# Patient Record
Sex: Male | Born: 1990 | Race: Black or African American
Health system: Southern US, Community
[De-identification: ages and names within clinical notes are randomized; demographics above are authoritative.]

## PROBLEM LIST (undated history)

## (undated) DIAGNOSIS — K589 Irritable bowel syndrome without diarrhea: Secondary | ICD-10-CM

## (undated) DIAGNOSIS — K639 Disease of intestine, unspecified: Secondary | ICD-10-CM

## (undated) NOTE — ED Triage Notes (Signed)
Formatting of this note might be different from the original.  Presents with abdominal pain states " I believe its from the alcohol I drank last night.  Denies n/v   Electronically signed by Elaina Hoops, RN at 09/28/2021 11:26 AM EST

## (undated) NOTE — ED Provider Notes (Signed)
Formatting of this note is different from the original.  Images from the original note were not included.  EMERGENCY DEPARTMENT HISTORY AND PHYSICAL EXAM    Date: 09/28/2021  Patient Name: Eric [name]    History of Presenting Illness     Chief Complaint   Patient presents with    Abdominal Pain     History Provided By: Patient    HPI: Eric [name], 6 y.o. male presents to the emergency department complaint of epigastric abdominal pain x1 day.  Patient reports the pain is sharp and burning in character, moderate in severity, intermittent without noted aggravating relieving factors.  Patient reports associated nausea without vomiting.  Patient reports history of the same.  Patient denies fevers or chills.  Patient reports he drank alcohol last night and believes that is what is causing his pain, reports history of reflux and gastritis.  Patient denies radiation of the pain.    There are no other complaints, changes, or physical findings at this time.    PCP: No primary care provider on file.    No current facility-administered medications on file prior to encounter.     No current outpatient medications on file prior to encounter.     Past History     Past Medical History:  No past medical history on file.    Past Surgical History:  No past surgical history on file.    Family History:  No family history on file.    Social History:      Allergies:  No Known Allergies    Review of Systems   Review of Systems  Review of Systems   Constitutional: Negative for chills and fever.   HENT: Negative for sinus pressure and sinus pain.    Eyes: Negative for photophobia and redness.   Respiratory: Negative for shortness of breath and wheezing.    Cardiovascular: Negative for chest pain and palpitations.   Gastrointestinal: Positive for abdominal pain and nausea.   Genitourinary: Negative for flank pain and hematuria.   Musculoskeletal: Negative for arthralgias and gait problem.   Skin: Negative for color change and pallor.    Neurological: Negative for dizziness and weakness.     Physical Exam   Physical Exam  Physical Exam  Constitutional:       General: No acute distress.     Appearance: Normal appearance.  Not toxic-appearing.   HENT:      Head: Normocephalic and atraumatic.      Nose: Nose normal.      Mouth/Throat:      Mouth: Mucous membranes are moist.   Eyes:      Extraocular Movements: Extraocular movements intact.      Pupils: Pupils are equal, round, and reactive to light.   Cardiovascular:      Rate and Rhythm: Normal rate.      Pulses: Normal pulses.   Pulmonary:      Effort: Pulmonary effort is normal.      Breath sounds: No stridor.   Abdominal:      General: Abdomen is flat. There is no distension.  Mild epigastric tenderness to palpation without guarding or rebound.  Musculoskeletal:         General: Normal range of motion.      Cervical back: Normal range of motion and neck supple.   Skin:     General: Skin is warm and dry.      Capillary Refill: Capillary refill takes less than 2 seconds.   Neurological:  General: No focal deficit present.      Mental Status: Aert and oriented to person, place, and time.   Psychiatric:         Mood and Affect: Mood normal.         Behavior: Behavior normal.     Lab and Diagnostic Study Results   Labs -   No results found for this or any previous visit (from the past 12 hour(s)).    Radiologic Studies -   @lastxrresult @  CT Results  (Last 48 hours)      None         CXR Results  (Last 48 hours)      None         Medical Decision Making and ED Course   Differential Diagnosis & Medical Decision Making Provider Note:     - I am the first provider for this patient.  I reviewed the vital signs, available nursing notes, past medical history, past surgical history, family history and social history. The patients presenting problems have been discussed, and they are in agreement with the care plan formulated and outlined with them.  I have encouraged them to ask questions as they arise  throughout their visit.    Vital Signs-Reviewed the patient's vital signs.  Patient Vitals for the past 12 hrs:   Temp Pulse Resp BP SpO2   09/28/21 1125 97.8 F (36.6 C) 89 16 130/87 100 %     E    Disposition   Disposition: [facility]- Adult Discharges: All of the diagnostic tests were reviewed and questions answered. Diagnosis, care plan and treatment options were discussed.  The patient understands the instructions and will follow up as directed. The patients results have been reviewed with them.  They have been counseled regarding their diagnosis.  The patient verbally convey understanding and agreement of the signs, symptoms, diagnosis, treatment and prognosis and additionally agrees to follow up as recommended with their PCP in 24 - 48 hours.  They also agree with the care-plan and convey that all of their questions have been answered.  I have also put together some discharge instructions for them that include: 1) educational information regarding their diagnosis, 2) how to care for their diagnosis at home, as well a 3) list of reasons why they would want to return to the ED prior to their follow-up appointment, should their condition change.    DISCHARGE PLAN:  1. There are no discharge medications for this patient.    2.   Follow-up Information    None      3.  Return to ED if worse   4. There are no discharge medications for this patient.    Diagnosis/Clinical Impression     Clinical Impression:   1. Abdominal pain, epigastric      Attestations: ICecil Cranker, MD, am the primary clinician of record.    Please note that this dictation was completed with Dragon, the computer voice recognition software.  Quite often unanticipated grammatical, syntax, homophones, and other interpretive errors are inadvertently transcribed by the computer software.  Please disregard these errors.  Please excuse any errors that have escaped final proofreading.  Thank you.      Electronically signed by Cecil Cranker, MD at 09/28/2021 12:59 PM EST

---

## 2015-12-09 ENCOUNTER — Encounter (HOSPITAL_COMMUNITY): Payer: Self-pay | Admitting: Emergency Medicine

## 2015-12-09 ENCOUNTER — Emergency Department (HOSPITAL_COMMUNITY)
Admission: EM | Admit: 2015-12-09 | Discharge: 2015-12-09 | Disposition: A | Discharge status: Home / Self Care | Payer: Self-pay | Attending: Emergency Medicine | Admitting: Emergency Medicine

## 2015-12-09 DIAGNOSIS — K5900 Constipation, unspecified: Secondary | ICD-10-CM

## 2015-12-09 DIAGNOSIS — R509 Fever, unspecified: Secondary | ICD-10-CM | POA: Insufficient documentation

## 2015-12-09 DIAGNOSIS — M545 Low back pain, unspecified: Secondary | ICD-10-CM

## 2015-12-09 LAB — URINALYSIS, ROUTINE W REFLEX MICROSCOPIC
Bilirubin Urine: NEGATIVE
Glucose, UA: NEGATIVE mg/dL
HGB URINE DIPSTICK: NEGATIVE
Ketones, ur: NEGATIVE mg/dL
LEUKOCYTES UA: NEGATIVE
NITRITE: NEGATIVE
PH: 7 (ref 5.0–8.0)
PROTEIN: NEGATIVE mg/dL
Specific Gravity, Urine: 1.005 (ref 1.005–1.030)

## 2015-12-09 MED ORDER — METHOCARBAMOL 500 MG PO TABS: 500.0000 mg | ORAL_TABLET | Freq: Two times a day (BID) | ORAL | Status: DC

## 2015-12-09 MED ORDER — KETOROLAC TROMETHAMINE 60 MG/2ML IM SOLN
60.0000 mg | Freq: Once | INTRAMUSCULAR | Status: AC
  Administered 2015-12-09: 60 mg via INTRAMUSCULAR
  Filled 2015-12-09: qty 2

## 2015-12-09 MED ORDER — IBUPROFEN 800 MG PO TABS: 800.0000 mg | ORAL_TABLET | Freq: Three times a day (TID) | ORAL | Status: DC

## 2015-12-09 MED ORDER — CALCIUM POLYCARBOPHIL 625 MG PO TABS: 625.0000 mg | ORAL_TABLET | Freq: Every day | ORAL | Status: DC

## 2015-12-09 NOTE — Discharge Instructions (Signed)
You have been seen today for back pain. Your lab tests showed no abnormalities. Follow up with PCP as needed. Return to ED should symptoms worsen. Use the ibuprofen for pain and inflammation. laqad kan yanzur alyawm lialam alzzuhir. wa'azharat alfuhusat almukhbariat alkhassat bik 'ay shudhudhin. mutabaeat mae hizb almutamar alshshaebi hsb alhajati. aleawdat 'iilaa aldduef aljinsi yjb 'an tasu' al'aeradu. aistikhdam al'iibubrufin litakhfif al'alm walailtihabat.  Emergency Department Resource Guide 1) Find a Doctor and Pay Out of Pocket Although you won't have to find out who is covered by your insurance plan, it is a good idea to ask around and get recommendations. You will then need to call the office and see if the doctor you have chosen will accept you as a new patient and what types of options they offer for patients who are self-pay. Some doctors offer discounts or will set up payment plans for their patients who do not have insurance, but you will need to ask so you aren't surprised when you get to your appointment.  2) Contact Your Local Health Department Not all health departments have doctors that can see patients for sick visits, but many do, so it is worth a call to see if yours does. If you don't know where your local health department is, you can check in your phone book. The CDC also has a tool to help you locate your state's health department, and many state websites also have listings of all of their local health departments.  3) Find a Walk-in Clinic If your illness is not likely to be very severe or complicated, you may want to try a walk in clinic. These are popping up all over the country in pharmacies, drugstores, and shopping centers. They're usually staffed by nurse practitioners or physician assistants that have been trained to treat common illnesses and complaints. They're usually fairly quick and inexpensive. However, if you have serious medical issues or chronic medical problems,  these are probably not your best option.  No Primary Care Doctor: - Call Health Connect at  [phone] - they can help you locate a primary care doctor that  accepts your insurance, provides certain services, etc. - Physician Referral Service- [phone]  Chronic Pain Problems: Organization         Address  Phone   Notes  [facility]  [phone] Patients need to be referred by their primary care doctor.   Medication Assistance: Organization         Address  Phone   Notes  [facility] Medication [facility] [address] [place] [place], [place] [address] [phone] --Must be a resident of [facility] -- Must have NO insurance coverage whatsoever (no Medicaid/ Medicare, etc.) -- The pt. MUST have a primary care doctor that directs their care regularly and follows them in the community   [facility]  [phone]   [employer]  [phone]    Agencies that provide inexpensive medical care: Organization         Address  Phone   Notes  [facility]  [phone]   [facility]    [phone]   [facility] [address] [place], [place] [address] [phone]   [facility] [address] [place] [address], [place] [phone]   [facility]anned Parenthood    [phone]   [facility]    [phone]   [facility]  [address]  Lynne [place] Phone:  [phone], Fax:  [phone] Hours of Operation:  9 am - 6 pm, M-F.  Also accepts Medicaid/Medicare and self-pay.  [facility]  [address], [place] Phone: [phone], Fax: [phone]. Hours of Operation:  8:30 am - 5:30 pm, M-F.  Also accepts Medicaid and self-pay.  [facility] [place] [address], [place] Phone: [phone]   [facility] [address] Natasha Bence [place], [place] [phone], Ext. [phone] Mondays &  Thursdays: 7-9 AM.  First 15 patients are seen on a first come, first serve basis.    Medicaid-accepting [facility] Providers:  Organization         Address  Phone   Notes  [facility] [address], [place] [phone] Also accepts self-pay patients.  [facility] [address] Laurell Josephs [place], [place]  [phone]   [facility] [address], [place] [phone]   [facility] [address], [place] [phone]   Renaye Rakers [address], [place]   [phone] Only accepts [place] Access [place] patients after they have their name applied to their card.   Self-Pay (no insurance) in [facility]:  Organization         Address  Phone   Notes  Sickle Cell Patients, [facility] [address] [place], [place] [phone]   [facility] [address] [place], [place] [phone]   [facility] [place]  [address], [place] [phone]   [facility]/Dr. Osei-Bonsu  [address] [place] or [address], [place] [phone] Phone number for both [place] and [place] locations is the same.  [facility] and [facility] [address], [place] [phone]   [facility] [address] [place] or [address] [phone]   [facility] [address] [place] [phone], phone; [phone], fax Sees patients 1st and 3rd Saturday of every month.  Must not qualify for public or private insurance (i.e. Medicaid, Medicare, [place] Health Choice, Veterans' Benefits)  Household income should be no more than 200% of the poverty level The clinic cannot treat you if you are pregnant or think you are pregnant  Sexually transmitted diseases are not treated at the  clinic.    Dental Care: Organization         Address  Phone  Notes  [facility] Department of [facility] [address] [place], [place] [phone] Accepts children up to age 13 who are enrolled in [place] or [place] Health Choice; pregnant women with a Medicaid card; and children who have applied for Medicaid or [place] Health Choice, but were declined, whose parents can pay a reduced fee at time of service.  [facility] Department of [facility]  [address], [place] [phone] Accepts children up to age 85 who are enrolled in [place] or [place] Health Choice; pregnant women with a Medicaid card; and children who have applied for Medicaid or [place] Health Choice, but were declined, whose parents can pay a reduced fee at time of service.  [facility]  [address] [place], [place] [phone] Patients are seen  by appointment only. Walk-ins are not accepted. [facility] will see patients 69 years of age and older. Monday - Tuesday (8am-5pm) Most Wednesdays (8:30-5pm) $30 per visit, cash only  [facility]  [address], [facility] [phone] Patients are seen by appointment only. Walk-ins are not accepted. [facility] will see patients 43 years of age and older. One Wednesday Evening (Monthly: Volunteer Based).  $30 per visit, cash only  [employer]  [phone] for adults; Children under age 26, call Graduate Pediatric Dentistry at [phone]. Children aged 69-14, please call [phone] to request a pediatric application.  Dental services are provided in all areas of dental care including fillings, crowns and bridges, complete and partial dentures, implants, gum treatment, root canals, and extractions. Preventive care is also provided. Treatment is provided to both adults and children. Patients are selected via a lottery and there is often a  waiting list.   [facility] [address] [place]  [phone] [email or web address]   [facility] [address] [place], [place] [phone], Ext. [phone] Second and Fourth Thursday of each month, opens at 6:30 AM; Clinic ends at 9 AM.  Patients are seen on a first-come first-served basis, and a limited number are seen during each clinic.   [facility]  [address] Ether Griffins [place], [place] [phone]   Eligibility Requirements You must have lived in [place], [place], or [place] counties for at least the last three months.   You cannot be eligible for state or federal sponsored [place], including [employer], [place], or [employer].   You generally cannot be eligible for healthcare insurance through your employer.    How to apply: Eligibility screenings are held every Tuesday and Wednesday afternoon from 1:00 pm until 4:00 pm. You do not need an appointment for the interview!  [facility] [address], [place], [place] [phone]   [facility]  [phone]   [facility] Health Department  [phone]   [facility] Health Department  [phone]    Behavioral Health Resources in the Community: Intensive Outpatient Programs Organization         Address  Phone  Notes  [facility] [address], [place], [place] [phone]   [facility] [address], [place], [place] [phone]   [facility] [address] [place], [place]  [phone]   [facility] [address],  [place], [place] [phone] or [phone]   Substance Abuse Resources Organization         Address  Phone  Notes  [facility]  5140652066   [facility]  8310292611   [facility]  [phone]   Floydene Flock  [phone]   Residential & Outpatient Substance Abuse  Program  [phone]   Psychological Services Organization         Address  Phone  Notes  [facility] Behavior[facility] Health  [phone]   [facility] Services  [phone]   [facility] Mental Health [address] [place] [phone] or [phone]    Mobile Crisis Teams Organization         Address  Phone  Notes  [facility], Mobile Crisis Care Unit  [phone]   [facility]  [address] [place], [place] [phone]   [facility] [address] [place]  [phone]    Self-Help/Support Groups Organization         Address  Phone             Notes  [facility] [place] - variety of support groups  336- I7437963 Call for more information  Narcotics Anonymous (NA), [facility] [address], [employer] [place]  2 meetings at this location   Statistician         Address  Phone  Notes  [facility] [address],    [place]  [phone]   [facility]  [address], [place] [address], [place], [place] [phone]   [facility] [address] [place], [place] [phone] Admissions: 8am-3pm M-F  [facility] [address]    [place], [place] [phone]   [facility] [address] [place], [place], [place] [phone]   [facility] [address]  [place], [place] [phone]   [facility] - Intensive Outpatient [address] Laurell Josephs [address], [place], [place] [phone]   [facility] ([facility] [address] [place]  [place], [place] [phone] or [phone]   [facility] ([facility]) [address] [place], [place] [phone] Accepts Medicaid  [facility] [address],  [place] [phone] Substance Abuse/Addiction Treatment   [facility] Organization          Address  Phone  Notes  [facility]  [phone]   Angie Fava, PhD [address] Ervin Knack [place], [place]   [phone] or [phone]   [facility]al   [address] [place], [place] [phone]   [facility] [address], [place], [place] [phone] Insurance/Medicaid/sponsorship through [facility] and Families [address]                                    [place], [place] [phone] Therapy/tele-psych/case  [facility] [address] [place], [place] [phone]    Dr. Lolly Mustache  [phone]   [facility]  [employer] [facility] Dept. 1) [address] [place] 2) [address] [place] 3)  [address], [place] [phone]  [phone]   [facility] Child Abuse Hotline 403-106-7838 or [phone] (After Hours)

## 2015-12-09 NOTE — ED Notes (Signed)
C/o L lower back pain.  Pt states he has been lifting heavy items at work.  Denies urinary complaints.

## 2015-12-09 NOTE — ED Provider Notes (Signed)
CSN: [id number]     Arrival date & time 12/09/15  2223 History  By signing my name below, I, Aaron Ray, attest that this documentation has been prepared under the direction and in the presence of non-physician practitioner, Aaron Rutherford, PA-C. Electronically Signed: Freida Ray, Scribe. 12/09/2015. 11:02 PM.      Chief Complaint  Patient presents with  . Back Pain     The history is provided by the patient. A language interpreter was used (Arabic).     HPI Comments:  Aaron Ray is a 25 y.o. male who presents to the Emergency Department complaining of 8/10 sharp left lower back pain x 2 days. He notes he has been doing heavy lifting at home. Pt's pain is exacerbated when he bends over.  He also reports associated subjective fever and consitpation. His last BM was today ~ 1 hour ago. Pt notes the stool was hard. He denies urinary symptoms, nausea, vomiting, neurologic deficits, or any other complaints. No alleviating factors noted. Pt is not a native english speaker and speaks only Arabic, language line was used to obtain history and to perform physical exam.      History reviewed. No pertinent past medical history. History reviewed. No pertinent past surgical history. No family history on file. Social History  Substance Use Topics  . Smoking status: Never Smoker   . Smokeless tobacco: None  . Alcohol Use: No    Review of Systems  Constitutional: Positive for fever (subjective).  Gastrointestinal: Positive for constipation.  Musculoskeletal: Positive for back pain.    Allergies  Review of patient's allergies indicates not on file.  Home Medications   Prior to Admission medications   Medication Sig Start Date End Date Taking? Authorizing Provider  ibuprofen (ADVIL,MOTRIN) 800 MG tablet Take 1 tablet (800 mg total) by mouth 3 (three) times daily. 12/09/15   Aaron Ray C Aaron Haefner, PA-C  methocarbamol (ROBAXIN) 500 MG tablet Take 1 tablet (500 mg total) by mouth 2 (two) times daily.  12/09/15   Aaron Ray C Shaynah Hund, PA-C  polycarbophil (FIBERCON) 625 MG tablet Take 1 tablet (625 mg total) by mouth daily. Aaron Ray 1 qars (625 malagh alkla) ean tariq alfamm yawmiaa. 12/09/15   Aaron Ray C Undrea Archbold, PA-C   BP 140/94 mmHg  Pulse 86  Temp(Src) 98.4 F (36.9 C) (Oral)  Resp 16  Ht  (1.854 m)  Wt 57.72 kg  BMI 16.79 kg/m2  SpO2 100% Physical Exam  Constitutional: He is oriented to person, place, and time. He appears well-developed and well-nourished. No distress.  HENT:  Head: Normocephalic and atraumatic.  Eyes: Conjunctivae are normal.  Neck: Normal range of motion. Neck supple.  Cardiovascular: Normal rate and intact distal pulses.   Pulmonary/Chest: Effort normal.  Abdominal: Soft. He exhibits no distension. There is no tenderness. There is no CVA tenderness.  Musculoskeletal:  FROM to all extremities and spine. No paraspinal tenderness Tenderness lower left lumbar musculature   Lymphadenopathy:    He has no cervical adenopathy.  Neurological: He is alert and oriented to person, place, and time. He has normal strength and normal reflexes. No cranial nerve deficit or sensory deficit. He exhibits normal muscle tone. Coordination and gait normal.  No sensory deficits. Strength 5/5 in all extremities. No gait disturbance. Coordination intact.   Skin: Skin is warm and dry.  Psychiatric: He has a normal mood and affect.  Nursing note and vitals reviewed.   ED Course  Procedures  DIAGNOSTIC STUDIES:  Oxygen Saturation is 100% on RA,  normal- by my interpretation.    COORDINATION OF CARE:  10:58 PM Will order pain meds in ED and discharge with back exercises. Discussed treatment plan with pt at bedside with the aid of the language line and pt agreed to plan.    MDM   Final diagnoses:  Left-sided low back pain without sciatica  Constipation, unspecified constipation type    Aaron Ray presents with back pain for the last 2 days after an incident of heavy  lifting.  Patient with back pain.  No neurological deficits and normal neuro exam.  Patient is afebrile, not tachycardic, is normotensive, nontoxic appearing, and is in no apparent distress. Patient is ambulatory.  No loss of bowel or bladder control.  No concern for cauda equina.  No night sweats, weight loss, h/o cancer, IVDA, no recent procedure to back. No urinary symptoms suggestive of UTI.  Suspect lumbar muscle strain. Supportive care and return precaution discussed. Appears safe for discharge at this time. Follow up as indicated in discharge paperwork.    I personally performed the services described in this documentation, which was scribed in my presence. The recorded information has been reviewed and is accurate.    Aaron Pancoast, PA-C 12/09/15 2341  Aaron Crease, MD 12/10/15 [phone]

## 2015-12-11 LAB — URINE CULTURE: Culture: NO GROWTH

## 2016-01-07 ENCOUNTER — Emergency Department (HOSPITAL_COMMUNITY)
Admission: EM | Admit: 2016-01-07 | Discharge: 2016-01-07 | Disposition: A | Discharge status: Home / Self Care | Payer: Self-pay | Attending: Emergency Medicine | Admitting: Emergency Medicine

## 2016-01-07 ENCOUNTER — Encounter (HOSPITAL_COMMUNITY): Payer: Self-pay | Admitting: Emergency Medicine

## 2016-01-07 DIAGNOSIS — R42 Dizziness and giddiness: Secondary | ICD-10-CM | POA: Insufficient documentation

## 2016-01-07 DIAGNOSIS — Z791 Long term (current) use of non-steroidal anti-inflammatories (NSAID): Secondary | ICD-10-CM | POA: Insufficient documentation

## 2016-01-07 DIAGNOSIS — Z8719 Personal history of other diseases of the digestive system: Secondary | ICD-10-CM | POA: Insufficient documentation

## 2016-01-07 DIAGNOSIS — R112 Nausea with vomiting, unspecified: Secondary | ICD-10-CM | POA: Insufficient documentation

## 2016-01-07 DIAGNOSIS — Z79899 Other long term (current) drug therapy: Secondary | ICD-10-CM | POA: Insufficient documentation

## 2016-01-07 LAB — COMPREHENSIVE METABOLIC PANEL
ALBUMIN: 3.9 g/dL (ref 3.5–5.0)
ALK PHOS: 61 U/L (ref 38–126)
ALT: 10 U/L — AB (ref 17–63)
ANION GAP: 7 (ref 5–15)
AST: 16 U/L (ref 15–41)
BILIRUBIN TOTAL: 1.4 mg/dL — AB (ref 0.3–1.2)
BUN: 11 mg/dL (ref 6–20)
CALCIUM: 9.2 mg/dL (ref 8.9–10.3)
CO2: 25 mmol/L (ref 22–32)
CREATININE: 1.05 mg/dL (ref 0.61–1.24)
Chloride: 106 mmol/L (ref 101–111)
Glucose, Bld: 105 mg/dL — ABNORMAL HIGH (ref 65–99)
Potassium: 3.7 mmol/L (ref 3.5–5.1)
Sodium: 138 mmol/L (ref 135–145)
TOTAL PROTEIN: 6.9 g/dL (ref 6.5–8.1)

## 2016-01-07 LAB — CBC
HCT: 42.8 % (ref 39.0–52.0)
HEMOGLOBIN: 14.4 g/dL (ref 13.0–17.0)
MCH: 24.1 pg — AB (ref 26.0–34.0)
MCHC: 33.6 g/dL (ref 30.0–36.0)
MCV: 71.6 fL — ABNORMAL LOW (ref 78.0–100.0)
PLATELETS: 189 10*3/uL (ref 150–400)
RBC: 5.98 MIL/uL — AB (ref 4.22–5.81)
RDW: 14.3 % (ref 11.5–15.5)
WBC: 9.3 10*3/uL (ref 4.0–10.5)

## 2016-01-07 LAB — LIPASE, BLOOD: LIPASE: 26 U/L (ref 11–51)

## 2016-01-07 MED ORDER — MECLIZINE HCL 25 MG PO TABS
25.0000 mg | ORAL_TABLET | Freq: Once | ORAL | Status: AC
  Administered 2016-01-07: 25 mg via ORAL
  Filled 2016-01-07: qty 1

## 2016-01-07 MED ORDER — ONDANSETRON 4 MG PO TBDP
ORAL_TABLET | ORAL | Status: AC
  Filled 2016-01-07: qty 1

## 2016-01-07 MED ORDER — PANTOPRAZOLE SODIUM 40 MG IV SOLR
40.0000 mg | Freq: Once | INTRAVENOUS | Status: AC
  Administered 2016-01-07: 40 mg via INTRAVENOUS
  Filled 2016-01-07: qty 40

## 2016-01-07 MED ORDER — ONDANSETRON 4 MG PO TBDP
4.0000 mg | ORAL_TABLET | Freq: Once | ORAL | Status: AC
Start: 2016-01-07 — End: 2016-01-07
  Administered 2016-01-07: 4 mg via ORAL

## 2016-01-07 MED ORDER — SODIUM CHLORIDE 0.9 % IV BOLUS (SEPSIS)
1000.0000 mL | Freq: Once | INTRAVENOUS | Status: AC
  Administered 2016-01-07: 1000 mL via INTRAVENOUS

## 2016-01-07 NOTE — ED Notes (Signed)
Abigail, PA at bedside at this time.  

## 2016-01-07 NOTE — Discharge Instructions (Signed)
Nausea and Vomiting °Nausea is a sick feeling that often comes before throwing up (vomiting). Vomiting is a reflex where stomach contents come out of your mouth. Vomiting can cause severe loss of body fluids (dehydration). Children and elderly adults can become dehydrated quickly, especially if they also have diarrhea. Nausea and vomiting are symptoms of a condition or disease. It is important to find the cause of your symptoms. °CAUSES  °· Direct irritation of the stomach lining. This irritation can result from increased acid production (gastroesophageal reflux disease), infection, food poisoning, taking certain medicines (such as nonsteroidal anti-inflammatory drugs), alcohol use, or tobacco use. °· Signals from the brain. These signals could be caused by a headache, heat exposure, an inner ear disturbance, increased pressure in the brain from injury, infection, a tumor, or a concussion, pain, emotional stimulus, or metabolic problems. °· An obstruction in the gastrointestinal tract (bowel obstruction). °· Illnesses such as diabetes, hepatitis, gallbladder problems, appendicitis, kidney problems, cancer, sepsis, atypical symptoms of a heart attack, or eating disorders. °· Medical treatments such as chemotherapy and radiation. °· Receiving medicine that makes you sleep (general anesthetic) during surgery. °DIAGNOSIS °Your caregiver may ask for tests to be done if the problems do not improve after a few days. Tests may also be done if symptoms are severe or if the reason for the nausea and vomiting is not clear. Tests may include: °· Urine tests. °· Blood tests. °· Stool tests. °· Cultures (to look for evidence of infection). °· X-rays or other imaging studies. °Test results can help your caregiver make decisions about treatment or the need for additional tests. °TREATMENT °You need to stay well hydrated. Drink frequently but in small amounts. You may wish to drink water, sports drinks, clear broth, or eat frozen  ice pops or gelatin dessert to help stay hydrated. When you eat, eating slowly may help prevent nausea. There are also some antinausea medicines that may help prevent nausea. °HOME CARE INSTRUCTIONS  °· Take all medicine as directed by your caregiver. °· If you do not have an appetite, do not force yourself to eat. However, you must continue to drink fluids. °· If you have an appetite, eat a normal diet unless your caregiver tells you differently. °· Eat a variety of complex carbohydrates (rice, wheat, potatoes, bread), lean meats, yogurt, fruits, and vegetables. °· Avoid high-fat foods because they are more difficult to digest. °· Drink enough water and fluids to keep your urine clear or pale yellow. °· If you are dehydrated, ask your caregiver for specific rehydration instructions. Signs of dehydration may include: °· Severe thirst. °· Dry lips and mouth. °· Dizziness. °· Dark urine. °· Decreasing urine frequency and amount. °· Confusion. °· Rapid breathing or pulse. °SEEK IMMEDIATE MEDICAL CARE IF:  °· You have blood or brown flecks (like coffee grounds) in your vomit. °· You have black or bloody stools. °· You have a severe headache or stiff neck. °· You are confused. °· You have severe abdominal pain. °· You have chest pain or trouble breathing. °· You do not urinate at least once every 8 hours. °· You develop cold or clammy skin. °· You continue to vomit for longer than 24 to 48 hours. °· You have a fever. °MAKE SURE YOU:  °· Understand these instructions. °· Will watch your condition. °· Will get help right away if you are not doing well or get worse. °  °This information is not intended to replace advice given to you by your health care provider. Make sure   you discuss any questions you have with your health care provider. °  °Document Released: 10/27/2005 Document Revised: 01/19/2012 Document Reviewed: 03/26/2011 °Elsevier Interactive Patient Education ©2016 [employer] ° °Vertigo °Vertigo means you feel  like you or your surroundings are moving when they are not. Vertigo can be dangerous if it occurs when you are at work, driving, or performing difficult activities.  °CAUSES  °Vertigo occurs when there is a conflict of signals sent to your brain from the visual and sensory systems in your body. There are many different causes of vertigo, including: °· Infections, especially in the inner ear. °· A bad reaction to a drug or misuse of alcohol and medicines. °· Withdrawal from drugs or alcohol. °· Rapidly changing positions, such as lying down or rolling over in bed. °· A migraine headache. °· Decreased blood flow to the brain. °· Increased pressure in the brain from a head injury, infection, tumor, or bleeding. °SYMPTOMS  °You may feel as though the world is spinning around or you are falling to the ground. Because your balance is upset, vertigo can cause nausea and vomiting. You may have involuntary eye movements (nystagmus). °DIAGNOSIS  °Vertigo is usually diagnosed by physical exam. If the cause of your vertigo is unknown, your caregiver may perform imaging tests, such as an MRI scan (magnetic resonance imaging). °TREATMENT  °Most cases of vertigo resolve on their own, without treatment. Depending on the cause, your caregiver may prescribe certain medicines. If your vertigo is related to body position issues, your caregiver may recommend movements or procedures to correct the problem. In rare cases, if your vertigo is caused by certain inner ear problems, you may need surgery. °HOME CARE INSTRUCTIONS  °· Follow your caregiver's instructions. °· Avoid driving. °· Avoid operating heavy machinery. °· Avoid performing any tasks that would be dangerous to you or others during a vertigo episode. °· Tell your caregiver if you notice that certain medicines seem to be causing your vertigo. Some of the medicines used to treat vertigo episodes can actually make them worse in some people. °SEEK IMMEDIATE MEDICAL CARE IF:   °· Your medicines do not relieve your vertigo or are making it worse. °· You develop problems with talking, walking, weakness, or using your arms, hands, or legs. °· You develop severe headaches. °· Your nausea or vomiting continues or gets worse. °· You develop visual changes. °· A family member notices behavioral changes. °· Your condition gets worse. °MAKE SURE YOU: °· Understand these instructions. °· Will watch your condition. °· Will get help right away if you are not doing well or get worse. °  °This information is not intended to replace advice given to you by your health care provider. Make sure you discuss any questions you have with your health care provider. °  °Document Released: 08/06/2005 Document Revised: 01/19/2012 Document Reviewed: 02/19/2015 °Elsevier Interactive Patient Education ©2016 Elsevier Inc. ° °

## 2016-01-07 NOTE — ED Notes (Signed)
Pt sts vomiting and dizziness starting today

## 2016-01-07 NOTE — ED Provider Notes (Signed)
CSN: [id number]     Arrival date & time 01/07/16  1436 History   First MD Initiated Contact with Patient 01/07/16 1911     Chief Complaint  Patient presents with  . Vomiting     (Consider location/radiation/quality/duration/timing/severity/associated sxs/prior Treatment) HPI  Is a 25 year old [place] male attended by 2 of his friends who presents the emergency department with chief complaint of dizziness and vomiting. The patient does not speak English and translation is provided by his friend. He has a previous history of reflux and states that he did take medicine for it. When he had symptoms in [place]. The patient states that this morning he became suddenly very dizzy and then began vomiting. He states that his dizziness has eased off somewhat. However, he feels dizzy when lying flat or standing. He does describe it as room spinning. He has had this previously, once before in [place]. He denies any abdominal pain, diarrhea or constipation. He denies fevers. He states that he is still feeling somewhat nauseous after Zofran. His vomit has been nonbloody and nonbilious. He has had multiple episodes of vomiting today.  History reviewed. No pertinent past medical history. History reviewed. No pertinent past surgical history. History reviewed. No pertinent family history. Social History  Substance Use Topics  . Smoking status: Never Smoker   . Smokeless tobacco: None  . Alcohol Use: No    Review of Systems  Ten systems reviewed and are negative for acute change, except as noted in the HPI.    Allergies  Review of patient's allergies indicates no known allergies.  Home Medications   Prior to Admission medications   Medication Sig Start Date End Date Taking? Authorizing Provider  ibuprofen (ADVIL,MOTRIN) 800 MG tablet Take 1 tablet (800 mg total) by mouth 3 (three) times daily. 12/09/15   Shawn C Joy, PA-C  methocarbamol (ROBAXIN) 500 MG tablet Take 1 tablet (500 mg total) by mouth 2  (two) times daily. 12/09/15   Shawn C Joy, PA-C  polycarbophil (FIBERCON) 625 MG tablet Take 1 tablet (625 mg total) by mouth daily. khudh 1 qars (625 malagh alkla) ean tariq alfamm yawmiaa. 12/09/15   Shawn C Joy, PA-C   BP 124/79 mmHg  Pulse 78  Temp(Src) 98.1 F (36.7 C) (Oral)  Resp 22  SpO2 100% Physical Exam  Constitutional: He is oriented to person, place, and time. He appears well-developed and well-nourished. No distress.  HENT:  Head: Normocephalic and atraumatic.  Mouth/Throat: Oropharynx is clear and moist.  Eyes: Conjunctivae and EOM are normal. Pupils are equal, round, and reactive to light. No scleral icterus.  No horizontal, vertical or rotational nystagmus  Neck: Normal range of motion. Neck supple.  Cardiovascular: Normal rate, regular rhythm and intact distal pulses.   Pulmonary/Chest: Effort normal and breath sounds normal. No respiratory distress. He has no wheezes. He has no rales.  Abdominal: Soft. Bowel sounds are normal. There is no tenderness. There is no rebound and no guarding.  Musculoskeletal: Normal range of motion.  Lymphadenopathy:    He has no cervical adenopathy.  Neurological: He is alert and oriented to person, place, and time. He has normal reflexes. No cranial nerve deficit. He exhibits normal muscle tone. Coordination normal.  Mental Status:  Alert, oriented, thought content appropriate. Speech fluent without evidence of aphasia. Able to follow 2 step commands without difficulty.  Cranial Nerves:  II:  Peripheral visual fields grossly normal, pupils equal, round, reactive to light III,IV, VI: ptosis not present, extra-ocular motions intact bilaterally  V,VII: smile symmetric, facial light touch sensation equal VIII: hearing grossly normal bilaterally  IX,X: midline uvula rise  XI: bilateral shoulder shrug equal and strong XII: midline tongue extension  Motor:  5/5 in upper and lower extremities bilaterally including strong and equal grip  strength and dorsiflexion/plantar flexion Sensory: Pinprick and light touch normal in all extremities.  Deep Tendon Reflexes: 2+ and symmetric  Cerebellar: normal finger-to-nose with bilateral upper extremities Gait: normal gait and balance CV: distal pulses palpable throughout   Skin: Skin is warm and dry. No rash noted. He is not diaphoretic.  Psychiatric: He has a normal mood and affect. His behavior is normal. Judgment and thought content normal.  Nursing note and vitals reviewed.   ED Course  Procedures (including critical care time) Labs Review Labs Reviewed  COMPREHENSIVE METABOLIC PANEL - Abnormal; Notable for the following:    Glucose, Bld 105 (*)    ALT 10 (*)    Total Bilirubin 1.4 (*)    All other components within normal limits  CBC - Abnormal; Notable for the following:    RBC 5.98 (*)    MCV 71.6 (*)    MCH 24.1 (*)    All other components within normal limits  LIPASE, BLOOD  URINALYSIS, ROUTINE W REFLEX MICROSCOPIC (NOT AT [facility])    Imaging Review No results found. I have personally reviewed and evaluated these images and lab results as part of my medical decision-making.   EKG Interpretation None      MDM   Final diagnoses:  Non-intractable vomiting with nausea, vomiting of unspecified type  Dizziness    Patient labs are not concerning.  His dizziness and vomiting have resolved. He is tolerating PO fluids. Will d/c with meclazine. Feel this represents a peripheral process     Arthor Captain, PA-C 01/13/16 1504  Eber Hong, MD 01/15/16 1218

## 2017-06-07 ENCOUNTER — Emergency Department (HOSPITAL_COMMUNITY): Payer: Self-pay

## 2017-06-07 ENCOUNTER — Encounter (HOSPITAL_COMMUNITY): Payer: Self-pay | Admitting: Emergency Medicine

## 2017-06-07 DIAGNOSIS — Z87891 Personal history of nicotine dependence: Secondary | ICD-10-CM | POA: Insufficient documentation

## 2017-06-07 DIAGNOSIS — R079 Chest pain, unspecified: Secondary | ICD-10-CM | POA: Insufficient documentation

## 2017-06-07 DIAGNOSIS — J069 Acute upper respiratory infection, unspecified: Secondary | ICD-10-CM | POA: Insufficient documentation

## 2017-06-07 LAB — BASIC METABOLIC PANEL
ANION GAP: 6 (ref 5–15)
BUN: 7 mg/dL (ref 6–20)
CALCIUM: 9 mg/dL (ref 8.9–10.3)
CO2: 25 mmol/L (ref 22–32)
Chloride: 105 mmol/L (ref 101–111)
Creatinine, Ser: 1.01 mg/dL (ref 0.61–1.24)
Glucose, Bld: 93 mg/dL (ref 65–99)
POTASSIUM: 3.4 mmol/L — AB (ref 3.5–5.1)
SODIUM: 136 mmol/L (ref 135–145)

## 2017-06-07 LAB — I-STAT TROPONIN, ED: TROPONIN I, POC: 0 ng/mL (ref 0.00–0.08)

## 2017-06-07 LAB — CBC
HCT: 40.8 % (ref 39.0–52.0)
HEMOGLOBIN: 13.4 g/dL (ref 13.0–17.0)
MCH: 23.3 pg — ABNORMAL LOW (ref 26.0–34.0)
MCHC: 32.8 g/dL (ref 30.0–36.0)
MCV: 71 fL — ABNORMAL LOW (ref 78.0–100.0)
Platelets: 238 10*3/uL (ref 150–400)
RBC: 5.75 MIL/uL (ref 4.22–5.81)
RDW: 14 % (ref 11.5–15.5)
WBC: 9.4 10*3/uL (ref 4.0–10.5)

## 2017-06-07 NOTE — ED Triage Notes (Signed)
Reports having a head cold with sore throat and chest pain in center of chest for about a week.  Trying OTC medication with no relief.

## 2017-06-08 ENCOUNTER — Emergency Department (HOSPITAL_COMMUNITY)
Admission: EM | Admit: 2017-06-08 | Discharge: 2017-06-08 | Disposition: A | Discharge status: Home / Self Care | Payer: Self-pay | Attending: Emergency Medicine | Admitting: Emergency Medicine

## 2017-06-08 DIAGNOSIS — J069 Acute upper respiratory infection, unspecified: Secondary | ICD-10-CM

## 2017-06-08 MED ORDER — BENZONATATE 100 MG PO CAPS: 100.0000 mg | ORAL_CAPSULE | Freq: Three times a day (TID) | ORAL | 0 refills | Status: DC

## 2017-06-08 NOTE — ED Provider Notes (Signed)
[facility]-EMERGENCY DEPT Provider Note   CSN: [id number] Arrival date & time: 06/07/17  2239     History   Chief Complaint Chief Complaint  Patient presents with  . cough/chest pain  . Chest Pain    HPI Aaron Ray is a 26 y.o. male.  Patient with no contributing medical history presents with complaint of symptoms that started 2 weeks ago with sore throat that progressed to hoarse voice, chest congestion and cough. At this point the sore throat is resolved. He states that his cough is worse at night after he lies down, and he feels his breathing is harder. No nausea, vomiting or known fever. No diarrhea. No significant nasal congestion or sinus pressure. No itchy, watery eyes, sneezing or rhinorrhea.    The history is provided by the patient and a relative. A language interpreter was used Garment/textile technologist(Interpreter is family member at bedside.).    History reviewed. No pertinent past medical history.  There are no active problems to display for this patient.   History reviewed. No pertinent surgical history.     Home Medications    Prior to Admission medications   Medication Sig Start Date End Date Taking? Authorizing Provider  ibuprofen (ADVIL,MOTRIN) 800 MG tablet Take 1 tablet (800 mg total) by mouth 3 (three) times daily. 12/09/15   Joy, Shawn C, PA-C  methocarbamol (ROBAXIN) 500 MG tablet Take 1 tablet (500 mg total) by mouth 2 (two) times daily. 12/09/15   Joy, Shawn C, PA-C  polycarbophil (FIBERCON) 625 MG tablet Take 1 tablet (625 mg total) by mouth daily. khudh 1 qars (625 malagh alkla) ean tariq alfamm yawmiaa. 12/09/15   Joy, Hillard DankerShawn C, PA-C    Family History No family history on file.  Social History Social History  Substance Use Topics  . Smoking status: Former Games developermoker  . Smokeless tobacco: Never Used  . Alcohol use No     Allergies   Patient has no known allergies.   Review of Systems Review of Systems  Constitutional: Negative for chills and fever.  HENT:  Positive for sore throat. Negative for congestion, rhinorrhea, sinus pressure and sneezing.   Respiratory: Positive for cough.   Cardiovascular: Negative.  Negative for chest pain.  Gastrointestinal: Negative.  Negative for nausea.  Musculoskeletal: Negative.  Negative for myalgias.  Skin: Negative.   Neurological: Negative.      Physical Exam Updated Vital Signs BP (!) 136/99 (BP Location: Left Arm)   Pulse 71   Temp 97.6 F (36.4 C) (Oral)   Resp 18   Ht 5\' 9"  (1.753 m)   Wt 57.6 kg (127 lb)   SpO2 100%   BMI 18.75 kg/m   Physical Exam  Constitutional: He is oriented to person, place, and time. He appears well-developed and well-nourished.  HENT:  Head: Normocephalic.  Nose: Nose normal.  Mouth/Throat: Oropharynx is clear and moist.  Neck: Normal range of motion. Neck supple.  Cardiovascular: Normal rate and regular rhythm.   Pulmonary/Chest: Effort normal and breath sounds normal. He has no wheezes. He has no rales.  Abdominal: Soft. Bowel sounds are normal. There is no tenderness. There is no rebound and no guarding.  Musculoskeletal: Normal range of motion.  Neurological: He is alert and oriented to person, place, and time.  Skin: Skin is warm and dry. No rash noted.  Psychiatric: He has a normal mood and affect.     ED Treatments / Results  Labs (all labs ordered are listed, but only abnormal results are displayed)  Labs Reviewed  BASIC METABOLIC PANEL - Abnormal; Notable for the following:       Result Value   Potassium 3.4 (*)    All other components within normal limits  CBC - Abnormal; Notable for the following:    MCV 71.0 (*)    MCH 23.3 (*)    All other components within normal limits  I-STAT TROPONIN, ED   Results for orders placed or performed during the hospital encounter of 06/08/17  Basic metabolic panel  Result Value Ref Range   Sodium 136 135 - 145 mmol/L   Potassium 3.4 (L) 3.5 - 5.1 mmol/L   Chloride 105 101 - 111 mmol/L   CO2 25 22 -  32 mmol/L   Glucose, Bld 93 65 - 99 mg/dL   BUN 7 6 - 20 mg/dL   Creatinine, Ser 0.10 0.61 - 1.24 mg/dL   Calcium 9.0 8.9 - 27.2 mg/dL   GFR calc non Af Amer >60 >60 mL/min   GFR calc Af Amer >60 >60 mL/min   Anion gap 6 5 - 15  CBC  Result Value Ref Range   WBC 9.4 4.0 - 10.5 K/uL   RBC 5.75 4.22 - 5.81 MIL/uL   Hemoglobin 13.4 13.0 - 17.0 g/dL   HCT 53.6 64.4 - 03.4 %   MCV 71.0 (L) 78.0 - 100.0 fL   MCH 23.3 (L) 26.0 - 34.0 pg   MCHC 32.8 30.0 - 36.0 g/dL   RDW 74.2 59.5 - 63.8 %   Platelets 238 150 - 400 K/uL  I-stat troponin, ED  Result Value Ref Range   Troponin i, poc 0.00 0.00 - 0.08 ng/mL   Comment 3             EKG  EKG Interpretation  Date/Time:  Sunday June 07 2017 22:44:23 EDT Ventricular Rate:  77 PR Interval:  238 QRS Duration: 82 QT Interval:  328 QTC Calculation: 371 R Axis:   92 Text Interpretation:  Sinus rhythm with 1st degree A-V block Possible Left atrial enlargement Rightward axis Borderline ECG No old tracing to compare Confirmed by Dione Booze ([id number]) on 06/07/2017 11:03:38 PM       Radiology Dg Chest 2 View  Result Date: 06/07/2017 CLINICAL DATA:  Sore throat and chest pain. EXAM: CHEST  2 VIEW COMPARISON:  None. FINDINGS: The heart size and mediastinal contours are within normal limits. Both lungs are clear. The visualized skeletal structures are unremarkable. IMPRESSION: No active cardiopulmonary disease. Electronically Signed   By: Ted Mcalpine M.D.   On: 06/07/2017 23:31    Procedures Procedures (including critical care time)  Medications Ordered in ED Medications - No data to display   Initial Impression / Assessment and Plan / ED Course  I have reviewed the triage vital signs and the nursing notes.  Pertinent labs & imaging results that were available during my care of the patient were reviewed by me and considered in my medical decision making (see chart for details).     Patient presents with progressive symptoms of  ST, hoarse voice (improved), night time cough. He is well appearing. His exam is benign, labs very reassuring. CXR clear.   Will provide Tessalon for night time use. Recommend follow up with PCP.  Final Clinical Impressions(s) / ED Diagnoses   Final diagnoses:  None   1. URI  New Prescriptions New Prescriptions   No medications on file     Elpidio Anis, Cordelia Poche 06/08/17 0141    Dione Booze,  MD 06/08/17 [phone]

## 2017-06-08 NOTE — Discharge Instructions (Signed)
Use Tessalon Perles at night for treatment of cough. Take Tylenol and/or ibuprofen as needed for any aches or discomfort. Push fluids. Return here with any worsening symptoms or new concern. Otherwise, follow up with a primary care provider for routine medical concern.

## 2017-06-16 ENCOUNTER — Encounter (HOSPITAL_COMMUNITY): Payer: Self-pay | Admitting: *Deleted

## 2017-06-16 ENCOUNTER — Emergency Department (HOSPITAL_COMMUNITY)
Admission: EM | Admit: 2017-06-16 | Discharge: 2017-06-16 | Disposition: A | Discharge status: Home / Self Care | Payer: Self-pay | Attending: Physician Assistant | Admitting: Physician Assistant

## 2017-06-16 ENCOUNTER — Emergency Department (HOSPITAL_COMMUNITY): Payer: Self-pay

## 2017-06-16 DIAGNOSIS — Y929 Unspecified place or not applicable: Secondary | ICD-10-CM | POA: Insufficient documentation

## 2017-06-16 DIAGNOSIS — Y999 Unspecified external cause status: Secondary | ICD-10-CM | POA: Insufficient documentation

## 2017-06-16 DIAGNOSIS — W010XXA Fall on same level from slipping, tripping and stumbling without subsequent striking against object, initial encounter: Secondary | ICD-10-CM | POA: Insufficient documentation

## 2017-06-16 DIAGNOSIS — Y9301 Activity, walking, marching and hiking: Secondary | ICD-10-CM | POA: Insufficient documentation

## 2017-06-16 DIAGNOSIS — S93402A Sprain of unspecified ligament of left ankle, initial encounter: Secondary | ICD-10-CM | POA: Insufficient documentation

## 2017-06-16 NOTE — ED Provider Notes (Signed)
[facility] Provider Note   CSN: [id number] Arrival date & time: 06/16/17  2014  By signing my name below, I, Rosario AdieWilliam Andrew Hiatt, attest that this documentation has been prepared under the direction and in the presence of Terance HartKelly Lace Chenevert, PA-C.  Electronically Signed: Rosario AdieWilliam Andrew Hiatt, ED Scribe. 06/16/17. 10:25 PM.  History   Chief Complaint Chief Complaint  Patient presents with  . Ankle Pain   The history is provided by the patient. No language interpreter was used.    HPI Comments: Aaron Ray is a 26 y.o. male who presents to the Emergency Department complaining of sudden onset, persistent left ankle pain beginning this morning s/p ground-level, mechanical fall. Pt reports that he was walking backwards when he tripped and inverted his left ankle. He reports that initially his pain was minimal; however, it has worsened throughout the day. His pain is worse with ambulation. No treatments for his pain were tried prior to coming into the ED. He denies numbness, weakness, or any other associated symptoms.   History reviewed. No pertinent past medical history.  There are no active problems to display for this patient.  History reviewed. No pertinent surgical history.  Home Medications    Prior to Admission medications   Medication Sig Start Date End Date Taking? Authorizing Provider  benzonatate (TESSALON) 100 MG capsule Take 1 capsule (100 mg total) by mouth every 8 (eight) hours. 06/08/17   Elpidio AnisUpstill, Shari, PA-C  ibuprofen (ADVIL,MOTRIN) 800 MG tablet Take 1 tablet (800 mg total) by mouth 3 (three) times daily. Patient not taking: Reported on 06/08/2017 12/09/15   Anselm PancoastJoy, Shawn C, PA-C  methocarbamol (ROBAXIN) 500 MG tablet Take 1 tablet (500 mg total) by mouth 2 (two) times daily. Patient not taking: Reported on 06/08/2017 12/09/15   Anselm PancoastJoy, Shawn C, PA-C  polycarbophil (FIBERCON) 625 MG tablet Take 1 tablet (625 mg total) by mouth daily. khudh 1 qars (625 malagh alkla) ean tariq  alfamm yawmiaa. Patient not taking: Reported on 06/08/2017 12/09/15   Anselm PancoastJoy, Shawn C, PA-C   Family History No family history on file.  Social History Social History  Substance Use Topics  . Smoking status: Former Games developermoker  . Smokeless tobacco: Never Used  . Alcohol use No   Allergies   Patient has no known allergies.  Review of Systems Review of Systems  Musculoskeletal: Positive for arthralgias and myalgias.  Neurological: Negative for weakness and numbness.   Physical Exam Updated Vital Signs BP 114/79 (BP Location: Left Arm)   Pulse 79   Temp 98.2 F (36.8 C) (Oral)   Resp 16   SpO2 100%   Physical Exam  Constitutional: He appears well-developed and well-nourished. No distress.  HENT:  Head: Normocephalic and atraumatic.  Eyes: Conjunctivae are normal.  Neck: Normal range of motion.  Cardiovascular: Normal rate.   Pulmonary/Chest: Effort normal.  Abdominal: He exhibits no distension.  Musculoskeletal: Normal range of motion. He exhibits tenderness.  No obvious swelling or deformity. He has TTP over the lateral aspect of the ankle. Achilles Is intact. No calf tenderness. Able to wiggle toes. 2+ DP pulse on the left.  Neurological: He is alert.  Skin: No pallor.  Psychiatric: He has a normal mood and affect. His behavior is normal.  Nursing note and vitals reviewed.  ED Treatments / Results  DIAGNOSTIC STUDIES: Oxygen Saturation is 100% on RA, normal by my interpretation.   COORDINATION OF CARE: 10:25 PM-Discussed next steps with pt. Pt verbalized understanding and is agreeable with the plan.  Labs (all labs ordered are listed, but only abnormal results are displayed) Labs Reviewed - No data to display  EKG  EKG Interpretation None      Radiology Dg Ankle Complete Left  Result Date: 06/16/2017 CLINICAL DATA:  Ankle injury EXAM: LEFT ANKLE COMPLETE - 3+ VIEW COMPARISON:  None. FINDINGS: There is no evidence of fracture, dislocation, or joint effusion.  There is no evidence of arthropathy or other focal bone abnormality. Soft tissues are unremarkable. IMPRESSION: No acute abnormality of the left ankle. Electronically Signed   By: Deatra Robinson M.D.   On: 06/16/2017 21:28   Procedures Procedures   Medications Ordered in ED Medications - No data to display  Initial Impression / Assessment and Plan / ED Course  I have reviewed the triage vital signs and the nursing notes.  Pertinent labs & imaging results that were available during my care of the patient were reviewed by me and considered in my medical decision making (see chart for details).  Patient XR negative for obvious fracture, dislocation, or other bony abnormalities. Pain managed in ED. Pt advised to follow up with orthopedics if symptoms persist for possibility of missed fracture diagnosis. Patient given brace while in ED, conservative therapy recommended and discussed. Patient will be d/c home. Pt is comfortable with above plan and is stable for discharge at this time. All questions were answered prior to disposition. Strict return precautions for f/u into the ED were discussed.   Final Clinical Impressions(s) / ED Diagnoses   Final diagnoses:  Sprain of left ankle, unspecified ligament, initial encounter   New Prescriptions New Prescriptions   No medications on file   I personally performed the services described in this documentation, which was scribed in my presence. The recorded information has been reviewed and is accurate.     Bethel Born, PA-C 06/16/17 2351    Abelino Derrick, MD 06/16/17 [phone]

## 2017-06-16 NOTE — Progress Notes (Signed)
Orthopedic Tech Progress Note Patient Details:  Aaron Ray 06/05/1991 [id number]  Ortho Devices Type of Ortho Device: ASO, Crutches Ortho Device/Splint Location: LLE Ortho Device/Splint Interventions: Ordered, Application   Jennye MoccasinHughes, [name] 06/16/2017, 10:43 PM

## 2017-06-16 NOTE — Discharge Instructions (Signed)
Rest - please stay off ankle as much as possible °Ice - ice for 20 minutes at a time, several times a day °Compression - wear brace to provide support °Elevate - elevate ankle above level of heart °Ibuprofen - take with food. Take up to 3-4 times daily ° °

## 2017-06-16 NOTE — ED Triage Notes (Signed)
Pt c/o L ankle pain with difficulty bearing weight. Reports rolling ankle inward

## 2017-10-25 ENCOUNTER — Encounter (HOSPITAL_COMMUNITY): Payer: Self-pay | Admitting: Emergency Medicine

## 2017-10-25 ENCOUNTER — Ambulatory Visit (HOSPITAL_COMMUNITY)
Admission: EM | Admit: 2017-10-25 | Discharge: 2017-10-25 | Disposition: A | Discharge status: Home / Self Care | Payer: Self-pay | Attending: Internal Medicine | Admitting: Internal Medicine

## 2017-10-25 ENCOUNTER — Other Ambulatory Visit: Payer: Self-pay

## 2017-10-25 DIAGNOSIS — B9789 Other viral agents as the cause of diseases classified elsewhere: Secondary | ICD-10-CM

## 2017-10-25 DIAGNOSIS — J069 Acute upper respiratory infection, unspecified: Secondary | ICD-10-CM

## 2017-10-25 MED ORDER — PREDNISONE 10 MG PO TABS: 40.0000 mg | ORAL_TABLET | Freq: Every day | ORAL | 0 refills | Status: AC

## 2017-10-25 MED ORDER — BENZONATATE 100 MG PO CAPS: 100.0000 mg | ORAL_CAPSULE | Freq: Three times a day (TID) | ORAL | 0 refills | Status: AC

## 2017-10-25 MED ORDER — HYDROCODONE-HOMATROPINE 5-1.5 MG/5ML PO SYRP: 5.0000 mL | ORAL_SOLUTION | Freq: Four times a day (QID) | ORAL | 0 refills | Status: AC | PRN

## 2017-10-25 NOTE — ED Provider Notes (Signed)
[facility]    CSN: [id number] Arrival date & time: 10/25/17  1633     History   Chief Complaint Chief Complaint  Patient presents with  . URI    HPI Aaron Ray is a 26 y.o. male.   26 year old male, with no significant past medical history, presenting today with cold-like symptoms.  Patient has had nonproductive cough, sore throat, nasal congestion, subjective fever and chills that started yesterday.  He has been taking NyQuil at home without much relief.  He denies any headache, neck pain or stiffness, chest pain, shortness of breath, abdominal pain, nausea or vomiting.  States that he has had several sick contacts with the same symptoms.   The history is provided by the patient.  URI  Presenting symptoms: congestion, cough, fatigue, rhinorrhea and sore throat   Presenting symptoms: no ear pain, no facial pain and no fever   Severity:  Moderate Onset quality:  Gradual Duration:  1 day Timing:  Constant Progression:  Unchanged Chronicity:  New Relieved by:  Nothing Worsened by:  Nothing Ineffective treatments:  OTC medications Associated symptoms: no arthralgias, no headaches, no myalgias, no neck pain, no sinus pain, no sneezing, no swollen glands and no wheezing   Risk factors: sick contacts   Risk factors: not elderly, no chronic cardiac disease, no chronic kidney disease, no chronic respiratory disease and no diabetes mellitus     History reviewed. No pertinent past medical history.  There are no active problems to display for this patient.   History reviewed. No pertinent surgical history.     Home Medications    Prior to Admission medications   Medication Sig Start Date End Date Taking? Authorizing Provider  Pseudoeph-Doxylamine-DM-APAP (NYQUIL PO) Take by mouth.   Yes [provider]  benzonatate (TESSALON) 100 MG capsule Take 1 capsule (100 mg total) by mouth every 8 (eight) hours. 10/25/17   Spyridon Hornstein C, PA-C    HYDROcodone-homatropine (HYCODAN) 5-1.5 MG/5ML syrup Take 5 mLs by mouth every 6 (six) hours as needed for cough. 10/25/17   Rivaan Kendall C, PA-C  predniSONE (DELTASONE) 10 MG tablet Take 4 tablets (40 mg total) by mouth daily for 5 days. 10/25/17 10/30/17  Alecia LemmingBlue, Shatana Saxton C, PA-C    Family History History reviewed. No pertinent family history.  Social History Social History   Tobacco Use  . Smoking status: Former Games developermoker  . Smokeless tobacco: Never Used  Substance Use Topics  . Alcohol use: No  . Drug use: No     Allergies   Patient has no known allergies.   Review of Systems Review of Systems  Constitutional: Positive for fatigue. Negative for chills and fever.  HENT: Positive for congestion, rhinorrhea and sore throat. Negative for ear pain, sinus pain and sneezing.   Eyes: Negative for pain and visual disturbance.  Respiratory: Positive for cough. Negative for shortness of breath and wheezing.   Cardiovascular: Negative for chest pain and palpitations.  Gastrointestinal: Negative for abdominal pain and vomiting.  Genitourinary: Negative for dysuria and hematuria.  Musculoskeletal: Negative for arthralgias, back pain, myalgias and neck pain.  Skin: Negative for color change and rash.  Neurological: Negative for seizures, syncope and headaches.  All other systems reviewed and are negative.    Physical Exam Triage Vital Signs ED Triage Vitals  Enc Vitals Group     BP 10/25/17 1709 116/85     Pulse Rate 10/25/17 1709 97     Resp 10/25/17 1709 18  Temp 10/25/17 1709 98.9 F (37.2 C)     Temp Source 10/25/17 1709 Oral     SpO2 10/25/17 1709 98 %     Weight --      Height --      Head Circumference --      Peak Flow --      Pain Score 10/25/17 1706 8     Pain Loc --      Pain Edu? --      Excl. in GC? --    No data found.  Updated Vital Signs BP 116/85 (BP Location: Right Arm)   Pulse 97   Temp 98.9 F (37.2 C) (Oral)   Resp 18   SpO2 98%   Visual  Acuity Right Eye Distance:   Left Eye Distance:   Bilateral Distance:    Right Eye Near:   Left Eye Near:    Bilateral Near:     Physical Exam  Constitutional: He appears well-developed and well-nourished.  HENT:  Head: Normocephalic and atraumatic.  Right Ear: Hearing, tympanic membrane, external ear and ear canal normal.  Left Ear: Hearing, tympanic membrane, external ear and ear canal normal.  Nose: Nose normal.  Mouth/Throat: Uvula is midline and oropharynx is clear and moist. No oropharyngeal exudate, posterior oropharyngeal edema, posterior oropharyngeal erythema or tonsillar abscesses.  Eyes: Conjunctivae are normal.  Neck: Neck supple.  Cardiovascular: Normal rate and regular rhythm.  No murmur heard. Pulmonary/Chest: Effort normal and breath sounds normal. No stridor. No respiratory distress. He has no decreased breath sounds. He has no wheezes. He has no rhonchi. He has no rales.  Abdominal: Soft. There is no tenderness.  Musculoskeletal: He exhibits no edema.  Neurological: He is alert.  Skin: Skin is warm and dry.  Psychiatric: He has a normal mood and affect.  Nursing note and vitals reviewed.    UC Treatments / Results  Labs (all labs ordered are listed, but only abnormal results are displayed) Labs Reviewed - No data to display  EKG  EKG Interpretation None       Radiology No results found.  Procedures Procedures (including critical care time)  Medications Ordered in UC Medications - No data to display   Initial Impression / Assessment and Plan / UC Course  I have reviewed the triage vital signs and the nursing notes.  Pertinent labs & imaging results that were available during my care of the patient were reviewed by me and considered in my medical decision making (see chart for details).     Viral uri - symptomatic relief   Final Clinical Impressions(s) / UC Diagnoses   Final diagnoses:  Viral URI with cough    ED Discharge Orders          Ordered    HYDROcodone-homatropine (HYCODAN) 5-1.5 MG/5ML syrup  Every 6 hours PRN     10/25/17 1720    predniSONE (DELTASONE) 10 MG tablet  Daily     10/25/17 1720    benzonatate (TESSALON) 100 MG capsule  Every 8 hours     10/25/17 1720       Controlled Substance Prescriptions [place] Controlled Substance Registry consulted? Yes, I have consulted the [place] Controlled Substances Registry for this patient, and feel the risk/benefit ratio today is favorable for proceeding with this prescription for a controlled substance.   Alecia LemmingBlue, Samadhi Mahurin C, [place] JerseyPA-C 10/25/17 1726

## 2017-10-25 NOTE — ED Triage Notes (Signed)
Symptoms started yesterday.  Patient has a cough, hot flashes , headache.  Patient complains of slight throat irritation

## 2018-04-26 DIAGNOSIS — R531 Weakness: Secondary | ICD-10-CM | POA: Insufficient documentation

## 2018-04-27 ENCOUNTER — Emergency Department: Payer: Self-pay

## 2018-04-27 ENCOUNTER — Encounter (INDEPENDENT_AMBULATORY_CARE_PROVIDER_SITE_OTHER): Payer: Self-pay

## 2018-04-27 ENCOUNTER — Emergency Department
Admission: EM | Admit: 2018-04-27 | Discharge: 2018-04-27 | Disposition: A | Discharge status: Home / Self Care | Payer: Self-pay | Attending: Emergency Medicine | Admitting: Emergency Medicine

## 2018-04-27 DIAGNOSIS — R531 Weakness: Secondary | ICD-10-CM

## 2018-04-27 LAB — CBC AND DIFFERENTIAL
Absolute NRBC: 0 10*3/uL (ref 0.00–0.00)
Basophils Absolute Automated: 0.03 10*3/uL (ref 0.00–0.08)
Basophils Automated: 0.2 %
Eosinophils Absolute Automated: 0.01 10*3/uL (ref 0.00–0.44)
Eosinophils Automated: 0.1 %
Hematocrit: 43.7 % (ref 37.6–49.6)
Hgb: 13.9 g/dL (ref 12.5–17.1)
Immature Granulocytes Absolute: 0.05 10*3/uL (ref 0.00–0.07)
Immature Granulocytes: 0.4 %
Lymphocytes Absolute Automated: 0.98 10*3/uL (ref 0.42–3.22)
Lymphocytes Automated: 7.3 %
MCH: 23.4 pg — ABNORMAL LOW (ref 25.1–33.5)
MCHC: 31.8 g/dL (ref 31.5–35.8)
MCV: 73.7 fL — ABNORMAL LOW (ref 78.0–96.0)
MPV: 11.4 fL (ref 8.9–12.5)
Monocytes Absolute Automated: 0.97 10*3/uL — ABNORMAL HIGH (ref 0.21–0.85)
Monocytes: 7.2 %
Neutrophils Absolute: 11.42 10*3/uL — ABNORMAL HIGH (ref 1.10–6.33)
Neutrophils: 84.8 %
Nucleated RBC: 0 /100 WBC (ref 0.0–0.0)
Platelets: 198 10*3/uL (ref 142–346)
RBC: 5.93 10*6/uL — ABNORMAL HIGH (ref 4.20–5.90)
RDW: 15 % (ref 11–15)
WBC: 13.46 10*3/uL — ABNORMAL HIGH (ref 3.10–9.50)

## 2018-04-27 LAB — URINALYSIS, REFLEX TO MICROSCOPIC EXAM IF INDICATED
Bilirubin, UA: NEGATIVE
Bilirubin, UA: NEGATIVE
Blood, UA: NEGATIVE
Blood, UA: NEGATIVE
Glucose, UA: NEGATIVE
Glucose, UA: NEGATIVE
Ketones UA: NEGATIVE
Ketones UA: NEGATIVE
Leukocyte Esterase, UA: NEGATIVE
Leukocyte Esterase, UA: NEGATIVE
Nitrite, UA: NEGATIVE
Nitrite, UA: NEGATIVE
Protein, UR: NEGATIVE
Protein, UR: NEGATIVE
Specific Gravity UA: 1.004 (ref 1.001–1.035)
Specific Gravity UA: 1.005 (ref 1.001–1.035)
Urine pH: 7 (ref 5.0–8.0)
Urine pH: 7 (ref 5.0–8.0)
Urobilinogen, UA: NORMAL mg/dL
Urobilinogen, UA: NORMAL mg/dL

## 2018-04-27 LAB — BASIC METABOLIC PANEL
BUN: 8 mg/dL — ABNORMAL LOW (ref 9.0–28.0)
CO2: 25 mEq/L (ref 22–29)
Calcium: 9.7 mg/dL (ref 8.5–10.5)
Chloride: 103 mEq/L (ref 100–111)
Creatinine: 0.9 mg/dL (ref 0.7–1.3)
Glucose: 93 mg/dL (ref 70–100)
Potassium: 3.7 mEq/L (ref 3.5–5.1)
Sodium: 138 mEq/L (ref 136–145)

## 2018-04-27 LAB — ECG 12-LEAD
Atrial Rate: 93 {beats}/min
P Axis: 78 degrees
P-R Interval: 216 ms
Q-T Interval: 310 ms
QRS Duration: 80 ms
QTC Calculation (Bezet): 385 ms
R Axis: 85 degrees
T Axis: 58 degrees
Ventricular Rate: 93 {beats}/min

## 2018-04-27 LAB — GFR: EGFR: >60

## 2018-04-27 LAB — IHS D-DIMER: D-Dimer: <0.27 ug/mL FEU (ref 0.00–0.50)

## 2018-04-27 LAB — I-STAT TROPONIN: i-STAT Troponin: 0 ng/mL (ref 0.00–0.09)

## 2018-04-27 MED ORDER — IBUPROFEN 600 MG PO TABS
600.00 mg | ORAL_TABLET | Freq: Once | ORAL | Status: AC
Start: 2018-04-27 — End: 2018-04-27
  Administered 2018-04-27: 06:00:00 600 mg via ORAL
  Filled 2018-04-27: qty 1

## 2018-04-27 NOTE — ED Notes (Signed)
PT understand NPO starting now.

## 2018-04-27 NOTE — ED Provider Notes (Addendum)
[facility] EMERGENCY DEPARTMENT ATTENDING PHYSICIAN NOTE     CLINICAL SUMMARY          Diagnosis:    .     Final diagnoses:   Generalized weakness         Disposition:       ED Disposition     ED Disposition Condition Date/Time Comment    Discharge  Tue Apr 27, 2018  5:46 AM Eric [name] discharge to home/self care.    Condition at disposition: Stable             Discharge Prescriptions     None                                            CLINICAL INFORMATION        HPI:       27 y.o. male p/w generalized weakness, CP and HA ongoing for ~1 day. A/w nausea and generalized weakness.  Symptoms started shortly after he drank some alcohol about a day ago. No cough or SOB. No fevers. No LE pain or swelling.      History obtained from:  Patient, male friend      ROS:      All systems are negative except as noted in the HPI.        Physical Exam:      Pulse (!) 113  BP 151/86  Resp 16  SpO2 99 %  Temp 98.8 F (37.1 C)    Nursing note and vitals reviewed.    Constitutional:  Comfortable. Full sentences.   Psych:  Normal affect. Cooperative.   Eyes: No conjunctival injection. No discharge.  Ears, Nose, Mouth and Throat:Tolerating secretions.  No hoarseness.  Respiratory: No respiratory distress. No retractions.  CTAB  Cardiovascular: No murmurs. Regular rhythm.   Abdomen: Soft. Non-tender    Musculoskeletal:           Neck:           Back:            Upper Extremity:           Lower Extremity: no edema. No calf or thigh tenderness.    Neurological: awake. Alert. Oriented. Full clear sentences. Normal concentration.    Integumentary:   GU:   Lymphatic:                PAST HISTORY        Primary Care Provider: Pcp, None, MD        PMH/PSH:      Eric [name] has no past medical history on file.  He has no past surgical history on file.      Social/Family History:      He reports that he has been smoking Cigarettes.  He has been smoking about 0.50 packs per day. His smokeless tobacco use includes Snuff. He reports  that he drinks alcohol. He reports that he does not use drugs.  His family history is not on file.      Listed Medications on Arrival:    .     Home Medications     Med List Status:  In Progress Set By: Lissa Morales, RN at 04/27/2018  3:34 AM        No Medications        Allergies: He has No Known Allergies.  VISIT INFORMATION        Clinical Course & Medical Decision Making:      At 3:32 AM:  27 y.o. male p/w CP, SOB and generalized weakness as described above.  In ER, pt is afebrile, satting well on RA and is hemodynamically normal.  WIll check electrolytes, d-dimer, trop EKG and CXR. NPO. Re-eval.     At 6:03 AM:  EKG is sinus w/ some prominent t-waves but no ST changes  Trop negative  D-dimer negative  Electrolytes wnl  CXR clear  Pt feels much better after some rest here in the ER  Will d/c  ER return instructions given  Pt a/w plan.           Medications Given in the ED:    .     ED Medication Orders     Start Ordered     Status Ordering Provider    04/27/18 0551 04/27/18 0550  ibuprofen (ADVIL,MOTRIN) tablet 600 mg  Once     Route: Oral  Ordered Dose: 600 mg     Last MAR action:  Given Crystallee Werden            Procedures:      Procedures      Interpretations:      EKG interpretation:   Reviewed and interpreted by Dr. Delorse Lek at 4:22 AM  Rate: Normal  Rhythm:Sinus  Axis: Normal  PR Interval:  1st degree av block  QRS Interval: Normal  QT Interval:  Normal  T Waves:  No inversions. No flattening.  ST Segments: No elevations or depressions.  Q Waves: None    Overall Impression: sinus, 1st degree av block, non-ischemic, no e/o pericarditis    EKG interpretation:   Reviewed and interpreted by Dr. Delorse Lek at 6:03 AM  Repeat EKG unchanged from the one done earlier this morning. Sinus, no ST changes, nl rate      OXYGEN SATURATION INTERPRETATION:  Oxygen saturation reviewed by me at 4:22 AM   93% on room air, no intervention needed              RESULTS        Lab Results:      Results     Procedure  Component Value Units Date/Time    D-Dimer [[id number]] Collected:  04/27/18 0348     Updated:  04/27/18 0450     D-Dimer <0.27 ug/mL FEU     i-Stat Troponin [[id number]] Collected:  04/27/18 0427     Updated:  04/27/18 0439     i-STAT Troponin 0.00 ng/mL     Basic Metabolic Panel [[id number]]  (Abnormal) Collected:  04/27/18 0348    Specimen:  Blood Updated:  04/27/18 0438     Glucose 93 mg/dL      BUN 8.0 (L) mg/dL      Creatinine 0.9 mg/dL      Calcium 9.7 mg/dL      Sodium 099 mEq/L      Potassium 3.7 mEq/L      Chloride 103 mEq/L      CO2 25 mEq/L     GFR [[id number]] Collected:  04/27/18 0348     Updated:  04/27/18 0438     EGFR >60.0    CBC with differential [[id number]]  (Abnormal) Collected:  04/27/18 0348    Specimen:  Blood from Blood Updated:  04/27/18 0423     WBC 13.46 (H) x10 3/uL      Hgb 13.9 g/dL  Hematocrit 43.7 %      Platelets 198 x10 3/uL      RBC 5.93 (H) x10 6/uL      MCV 73.7 (L) fL      MCH 23.4 (L) pg      MCHC 31.8 g/dL      RDW 15 %      MPV 11.4 fL      Neutrophils 84.8 %      Lymphocytes Automated 7.3 %      Monocytes 7.2 %      Eosinophils Automated 0.1 %      Basophils Automated 0.2 %      Immature Granulocyte 0.4 %      Nucleated RBC 0.0 /100 WBC      Neutrophils Absolute 11.42 (H) x10 3/uL      Abs Lymph Automated 0.98 x10 3/uL      Abs Mono Automated 0.97 (H) x10 3/uL      Abs Eos Automated 0.01 x10 3/uL      Absolute Baso Automated 0.03 x10 3/uL      Absolute Immature Granulocyte 0.05 x10 3/uL      Absolute NRBC 0.00 x10 3/uL     UA with Micro [[id number]] Collected:  04/27/18 0316    Specimen:  Urine Updated:  04/27/18 0330     Urine Type Clean Catch     Color, UA Colorless     Clarity, UA Clear     Specific Gravity UA 1.005     Urine pH 7.0     Leukocyte Esterase, UA Negative     Nitrite, UA Negative     Protein, UR Negative     Glucose, UA Negative     Ketones UA Negative     Urobilinogen, UA Normal mg/dL      Bilirubin, UA Negative     Blood, UA Negative    UA with Micro  [[id number]] Collected:  04/27/18 0301    Specimen:  Urine Updated:  04/27/18 0315     Urine Type Clean Catch     Color, UA Straw     Clarity, UA Clear     Specific Gravity UA 1.004     Urine pH 7.0     Leukocyte Esterase, UA Negative     Nitrite, UA Negative     Protein, UR Negative     Glucose, UA Negative     Ketones UA Negative     Urobilinogen, UA Normal mg/dL      Bilirubin, UA Negative     Blood, UA Negative              Radiology Results:      XR Chest 2 Views   Final Result      No acute abnormality.      Johnsie Kindred, MD    04/27/2018 5:40 AM                  Scribe Attestation:      I was acting as a Neurosurgeon for Delorse Lek, MD on [facility]  Treatment Team: Scribe: Al Nouman, Mohanned     I am the first provider for this patient and I personally performed the services documented. Treatment Team: Scribe: Al Nouman, Mohanned is scribing for me on Bean,Eric [name]. This note and the patient instructions accurately reflect work and decisions made by me.  Delorse Lek, MD             Delorse Lek, MD  04/27/18 [phone]

## 2018-04-27 NOTE — ED Notes (Signed)
PT ambulated out of ED independently. Understands discharge information and follow up.

## 2018-04-27 NOTE — Discharge Instructions (Signed)
Dear Eric [name]:    Thank you for choosing the [facility] Emergency Department for your healthcare needs.    Specific instructions for your visit today:      Weakness / Fatigue    You have been seen today for generalized weakness. This may also be described as fatigue.    Weakness is a common problem, especially in older individuals.    It is important to understand the difference between true weakness (real weakness from a nerve or brain problem) and the more common problem of fatigue. These words might seem similar but they do mean very different problems.   Fatigue: When a person is describing fatigue, they may feel tired out very quickly even with just a little activity. They may also say they are feeling tired, sleepy, easily exhausted and unable to do normal daily activities because they don't seem to have enough energy.   True Weakness: When someone has true weakness, it means that the muscles are not working right. For example, a leg might be truly weak if you can't support your weight on it or if you can't get up from a chair because the thigh muscles aren't strong enough.    There are many causes of weakness including; Infections (often kidney/bladder infections or pneumonias), electrolyte abnormalities (low sodium, low potassium), depression, and neurologic (brain or nerve) disorders.    After looking at the results of the blood tests or X-rays, the cause of your weakness is:   Unclear or unknown.    It is VERY IMPORTANT to see your primary care doctor. More testing may be needed to figure out the cause of your weakness.    YOU SHOULD SEEK MEDICAL ATTENTION IMMEDIATELY, EITHER HERE OR AT THE NEAREST EMERGENCY DEPARTMENT, IF ANY OF THE FOLLOWING OCCURS:   Confusion, coma, agitation (becoming anxious or irritable).   Fever (temperature higher than 100.84F / 38C), vomiting.   Severe headache.   Signs of stroke (paralysis or numbness on one side of the body, drooping on one side  of the face, difficulty talking).   Worsening weakness, difficulty standing, paralysis, loss of control of the bladder or bowels or difficulty swallowing.                 If you do not continue to improve or IF your condition worsens, please contact your PRIMARY doctor or return immediately to the Emergency Department FOR A RE-EVALUATION.      IF YOU DO NOT HAVE A PRIMARY DOCTOR TO FOLLOW-UP WITH PLEASE CONTACT THE [facility] TO FIND ONE:    [facility]  Telephone:  [phone]  Website: [email or web address]    Primary care physicians (PCP's) are either internists or family medicine doctors. Both types of PCP's focus on health promotion, disease prevention, patient education and counseling, and treatment of acute and chronic medical conditions.     Ask to see who is taking new patients.       Sincerely,  Delorse Lek, MD, MPH  Attending Emergency Physician  [facility] Emergency Department        YOUR CONTACT INFORMATION  Before leaving please check with registration to make sure we have an up-to-date contact number.  You can call registration at [phone] to update your information.  For questions about your hospital bill, please call [phone].  For questions about your Emergency Dept Physician bill please call [phone].        SEDATING MEDICATIONS    Sedating medications include strong pain  medications (e.g. Narcotics), muscle relaxers, benzodiazepines, benadryl (diphenhydramine) and other antihistamines.      While taking sedating medications:     - DO NOT drive a car     - DO NOT operate machinery     - DO NOT perform jobs where you need to be alert     - DO NOT drink alcoholic beverages    If you are unsure if you have received a sedating medication, please ask your physician or nurse.     If you get dizzy, sit or lie down at the first signs. Be careful going up and down stairs.  Be extra careful to prevent falls.    Keep this and all medicines out of reach of  children.    Keep all medicines in a cool, dry place. DO NOT keep them in your bathroom medicine cabinet or in a cabinet above the stove.      FREE HEALTH SERVICES  If you need help with health or social services, please call 2-1-1 for a free referral to resources in your area.  2-1-1 is a free service connecting people with information on health insurance, free clinics, pregnancy, mental health, dental care, food assistance, housing, and substance abuse counseling.  Also, available online at:  http://www.[facility].org      MEDICAL RECORDS AND TESTS  Certain laboratory test results do not come back the same day.   We will contact you if other important findings are noted.  Radiology films are often reviewed again to ensure accuracy.  If there is any discrepancy, we will notify you.      Please call [phone] to pick up a complimentary CD of any radiology studies performed.  If you or your doctor would like to request a copy of your medical records, please call [phone].        ORTHOPEDIC INJURY   Please know that significant injuries can exist even when an initial x-ray is read as normal or negative.  This can occur because some fractures (broken bones) are not initially visible on x-rays.  For this reason, close outpatient follow-up with your primary care doctor or bone specialist (orthopedist) is required.    The examination and treatment you have received in our Emergency Department is provided on an emergency basis, and is not intended to be a substitute for your primary care physician.  It is important that your primary doctor checks you again and that you report any new or remaining problems at that time.      Discharge Instructions    As always, you are the most important factor in your recovery.  Please follow these instructions carefully.  If you have problems that we have not discussed, CALL OR VISIT YOUR DOCTOR RIGHT AWAY.

## 2018-04-27 NOTE — Progress Notes (Signed)
[facility]    Received a referral to schedule a follow up appointment with the  [facility].  Left patient a voicemail and provided main clinic phone number.  Requested patient return call to schedule a follow up appointment as soon as possible.       Ingrid Benitez  Transitional Services   Sched Reg Rep II  T [phone]  F [phone]

## 2018-04-28 LAB — ECG 12-LEAD
Atrial Rate: 87 {beats}/min
P Axis: 75 degrees
P-R Interval: 226 ms
Q-T Interval: 320 ms
QRS Duration: 76 ms
QTC Calculation (Bezet): 385 ms
R Axis: 89 degrees
T Axis: 58 degrees
Ventricular Rate: 87 {beats}/min

## 2018-08-21 ENCOUNTER — Emergency Department
Admission: EM | Admit: 2018-08-21 | Discharge: 2018-08-21 | Disposition: A | Discharge status: Home / Self Care | Payer: Charity | Attending: Emergency Medicine | Admitting: Emergency Medicine

## 2018-08-21 DIAGNOSIS — K2920 Alcoholic gastritis without bleeding: Secondary | ICD-10-CM | POA: Insufficient documentation

## 2018-08-21 LAB — CBC AND DIFFERENTIAL
Absolute NRBC: 0 10*3/uL (ref 0.00–0.00)
Basophils Absolute Automated: 0.02 10*3/uL (ref 0.00–0.08)
Basophils Automated: 0.3 %
Eosinophils Absolute Automated: 0.02 10*3/uL (ref 0.00–0.44)
Eosinophils Automated: 0.3 %
Hematocrit: 45 % (ref 37.6–49.6)
Hgb: 14.2 g/dL (ref 12.5–17.1)
Immature Granulocytes Absolute: 0.01 10*3/uL (ref 0.00–0.07)
Immature Granulocytes: 0.2 %
Lymphocytes Absolute Automated: 1.01 10*3/uL (ref 0.42–3.22)
Lymphocytes Automated: 16.7 %
MCH: 23.5 pg — ABNORMAL LOW (ref 25.1–33.5)
MCHC: 31.6 g/dL (ref 31.5–35.8)
MCV: 74.6 fL — ABNORMAL LOW (ref 78.0–96.0)
MPV: 11.4 fL (ref 8.9–12.5)
Monocytes Absolute Automated: 0.62 10*3/uL (ref 0.21–0.85)
Monocytes: 10.3 %
Neutrophils Absolute: 4.35 10*3/uL (ref 1.10–6.33)
Neutrophils: 72.2 %
Nucleated RBC: 0 /100 WBC (ref 0.0–0.0)
Platelets: 244 10*3/uL (ref 142–346)
RBC: 6.03 10*6/uL — ABNORMAL HIGH (ref 4.20–5.90)
RDW: 14 % (ref 11–15)
WBC: 6.03 10*3/uL (ref 3.10–9.50)

## 2018-08-21 LAB — URINALYSIS REFLEX TO MICROSCOPIC EXAM - REFLEX TO CULTURE
Bilirubin, UA: NEGATIVE
Blood, UA: NEGATIVE
Glucose, UA: NEGATIVE
Ketones UA: NEGATIVE
Leukocyte Esterase, UA: NEGATIVE
Nitrite, UA: NEGATIVE
Protein, UR: NEGATIVE
Specific Gravity UA: 1.01 (ref 1.001–1.035)
Urine pH: 7 (ref 5.0–8.0)
Urobilinogen, UA: NORMAL mg/dL (ref 0.2–2.0)

## 2018-08-21 LAB — ETHANOL: Alcohol: NOT DETECTED mg/dL

## 2018-08-21 LAB — GFR: EGFR: >60

## 2018-08-21 LAB — COMPREHENSIVE METABOLIC PANEL
ALT: 22 U/L (ref 0–55)
AST (SGOT): 25 U/L (ref 5–34)
Albumin/Globulin Ratio: 1.2 (ref 0.9–2.2)
Albumin: 4.2 g/dL (ref 3.5–5.0)
Alkaline Phosphatase: 55 U/L (ref 38–106)
BUN: 7 mg/dL — ABNORMAL LOW (ref 9.0–28.0)
Bilirubin, Total: 1.7 mg/dL — ABNORMAL HIGH (ref 0.2–1.2)
CO2: 23 mEq/L (ref 22–29)
Calcium: 9.3 mg/dL (ref 8.5–10.5)
Chloride: 102 mEq/L (ref 100–111)
Creatinine: 0.9 mg/dL (ref 0.7–1.3)
Globulin: 3.5 g/dL (ref 2.0–3.6)
Glucose: 90 mg/dL (ref 70–100)
Potassium: 4 mEq/L (ref 3.5–5.1)
Protein, Total: 7.7 g/dL (ref 6.0–8.3)
Sodium: 137 mEq/L (ref 136–145)

## 2018-08-21 LAB — LIPASE: Lipase: 10 U/L (ref 8–78)

## 2018-08-21 MED ORDER — SODIUM CHLORIDE 0.9 % IV BOLUS
1000.00 mL | Freq: Once | INTRAVENOUS | Status: AC
Start: 2018-08-21 — End: 2018-08-21
  Administered 2018-08-21: 15:00:00 1000 mL via INTRAVENOUS

## 2018-08-21 MED ORDER — ONDANSETRON 4 MG PO TBDP
4.0000 mg | ORAL_TABLET | Freq: Four times a day (QID) | ORAL | 0 refills | Status: DC | PRN
Start: 2018-08-21 — End: 2020-02-27

## 2018-08-21 MED ORDER — FAMOTIDINE 10 MG/ML IV SOLN (WRAP)
20.00 mg | Freq: Once | INTRAVENOUS | Status: AC
Start: 2018-08-21 — End: 2018-08-21
  Administered 2018-08-21: 15:00:00 20 mg via INTRAVENOUS
  Filled 2018-08-21: qty 2

## 2018-08-21 MED ORDER — ONDANSETRON HCL 4 MG/2ML IJ SOLN
4.00 mg | Freq: Once | INTRAMUSCULAR | Status: AC
Start: 2018-08-21 — End: 2018-08-21
  Administered 2018-08-21: 15:00:00 4 mg via INTRAVENOUS
  Filled 2018-08-21: qty 2

## 2018-08-21 NOTE — Discharge Instructions (Signed)
Dear [name]:    Thank you for choosing the [facility] Emergency Department, the premier emergency department in the [place] area.  I hope your visit today was EXCELLENT.    Specific instructions for your visit today:    You came in to the ER with nausea and vomiting.  Your work up here showed alcoholic gastritis.  Please take zofran for nausea and take plenty of fluids.  You should follow up with your primary care doctor in 2 days.  Return to the ER if you have any new or worsening symptoms such as fever, or any other concerning signs or symptoms to you.     You can take over the counter Malox as needed for symptom relief.      IF YOU DO NOT CONTINUE TO IMPROVE OR YOUR CONDITION WORSENS, PLEASE CONTACT YOUR DOCTOR OR RETURN IMMEDIATELY TO THE EMERGENCY DEPARTMENT.    Sincerely,  Haciski, Emily Filbert, MD  Attending Emergency Physician  [facility] Emergency Department    ONSITE PHARMACY  Our full service onsite pharmacy is located in the ER waiting room.  Open 7 days a week from 9 am to 9 pm.  We accept all major insurances and prices are competitive with major retailers.  Ask your provider to print your prescriptions down to the pharmacy to speed you on your way home.    OBTAINING A PRIMARY CARE APPOINTMENT    Primary care physicians (PCPs, also known as primary care doctors) are either internists or family medicine doctors. Both types of PCPs focus on health promotion, disease prevention, patient education and counseling, and treatment of acute and chronic medical conditions.    Call for an appointment with a primary care doctor.  Ask to see who is taking new patients.     [facility]  telephone:  [phone]  [email or web address]    DOCTOR REFERRALS  Call [phone] (available 24 hours a day, 7 days a week) if you need any further referrals and we can help you find a primary care doctor or specialist.  Also, available online at:  [email or web address]    YOUR  CONTACT INFORMATION  Before leaving please check with registration to make sure we have an up-to-date contact number.  You can call registration at [phone] to update your information.  For questions about your hospital bill, please call [phone].  For questions about your Emergency Dept Physician bill please call [phone].      FREE HEALTH SERVICES  If you need help with health or social services, please call 2-1-1 for a free referral to resources in your area.  2-1-1 is a free service connecting people with information on health insurance, free clinics, pregnancy, mental health, dental care, food assistance, housing, and substance abuse counseling.  Also, available online at:  http://www.[facility].org    MEDICAL RECORDS AND TESTS  Certain laboratory test results do not come back the same day, for example urine cultures.   We will contact you if other important findings are noted.  Radiology films are often reviewed again to ensure accuracy.  If there is any discrepancy, we will notify you.      Please call [phone] to pick up a complimentary CD of any radiology studies performed.  If you or your doctor would like to request a copy of your medical records, please call [phone].      ORTHOPEDIC INJURY   Please know that significant injuries can exist even when an initial x-ray is read as normal  or negative.  This can occur because some fractures (broken bones) are not initially visible on x-rays.  For this reason, close outpatient follow-up with your primary care doctor or bone specialist (orthopedist) is required.    MEDICATIONS AND FOLLOWUP  Please be aware that some prescription medications can cause drowsiness.  Use caution when driving or operating machinery.    The examination and treatment you have received in our Emergency Department is provided on an emergency basis, and is not intended to be a substitute for your primary care physician.  It is important that your doctor checks you  again and that you report any new or remaining problems at that time.      24 HOUR PHARMACIES  The nearest 24 hour pharmacy is:    [facility]  [address]  [place], [place] [address]  [phone]      ASSISTANCE WITH INSURANCE    Affordable Care Act  [facility])  Call to start or finish an application, compare plans, enroll or ask a question.  [phone]  TTY: [phone]  Web:  Healthcare.gov    Help Enrolling in [facility]  [facility] [place]  321-208-2227 (TOLL-FREE)  763-251-5776 (TTY)  Web:  Http://www.coverva.org    Local Help Enrolling in the [facility]  [place] [facility]  [phone] (MAIN)  Email:  health-help@nvfs .org  Web:  [employer]jackMyths[employer]is  Address:  [address], [address] [place], [place] [address]    SEDATING MEDICATIONS  Sedating medications include strong pain medications (e.g. narcotics), muscle relaxers, benzodiazepines (used for anxiety and as muscle relaxers), Benadryl/diphenhydramine and other antihistamines for allergic reactions/itching, and other medications.  If you are unsure if you have received a sedating medication, please ask your physician or nurse.  If you received a sedating medication: DO NOT drive a car. DO NOT operate machinery. DO NOT perform jobs where you need to be alert.  DO NOT drink alcoholic beverages while taking this medicine.     If you get dizzy, sit or lie down at the first signs. Be careful going up and down stairs.  Be extra careful to prevent falls.     Never give this medicine to others.     Keep this medicine out of reach of children.     Do not take or save old medicines. Throw them away when outdated.     Keep all medicines in a cool, dry place. DO NOT keep them in your bathroom medicine cabinet or in a cabinet above the stove.    MEDICATION REFILLS  Please be aware that we cannot refill any prescriptions through the ER. If you need further treatment from what is provided at your ER visit, please follow up with your  primary care doctor or your pain management specialist.    FREESTANDING EMERGENCY DEPARTMENTS OF [facility]  Did you know [facility] has two freestanding ERs located just a few miles away?  [facility] [place] and [facility] [place] have short wait times, easy free parking directly in front of the building and top patient satisfaction scores - and the same Board Certified Emergency Medicine doctors as [facility].

## 2018-08-21 NOTE — ED Provider Notes (Addendum)
[facility] EMERGENCY DEPARTMENT H&P      Visit date: 08/21/2018      CLINICAL SUMMARY           Diagnosis:    .     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  On exam he has mild tenderness in the epigastric area that improved after medicine and fluids here.  Remainder of work-up is reassuring.  On reassessment patient has no more abdominal pain at all and states he is feeling much better.  Given this, cholecystitis symptomatic cholelithiasis seems unlikely.  Lipase is negative.  Therefore I feel he safe for discharge home with close follow-up with his primary care doctor.  We have given a prescription for Zofran for home and advised follow-up in a couple of days.  Patient states he understands and agrees with plan and knows to return to the ER if there are any new or worsening symptoms such as increasing pain vomiting fevers or any other concerning signs or symptoms to him.  We will discharge home.         Disposition:         Discharge           I was present for the bedside discharge alongside the patient's ED nurse. Course of visit in the ED and discharge instructions were reviewed with patient and they were given the opportunity to ask any questions regarding their care today. Patient and/ or patient's family verbalized understanding of, and comfort with, instructions and plan.          Discharge Prescriptions     Medication Sig Dispense Auth. Provider    ondansetron (ZOFRAN-ODT) 4 MG disintegrating tablet Take 1 tablet (4 mg total) by mouth every 6 (six) hours as needed for Nausea 4 tablet Damyon Mullane, Emily Filbert, MD                         CLINICAL INFORMATION        HPI:      Chief Complaint: Nausea and Abdominal Pain  .    Eric [name] is a 27 y.o. male with no PM hx who presents with acute onset of nausea  yesterday evening with two episodes of emesis this morning. He notes that last night he had 3 mixed drinks with vodka and that he has had no food or fluid intake last night or this morning. He notes that he drinks a few times a month and that he has not had symptoms like this before.    He denies any blood in the vomit, loss of bowel or urine control, or extremity weakness or numbness.      History obtained from: Patient          ROS:      Positive and negative ROS elements as per HPI.  All other systems reviewed and negative.      Physical Exam:      Pulse 95  BP 118/89  Resp 18  SpO2 99 %  Temp 98.8 F (37.1 C)    Physical Exam   Constitutional: Oriented to person, place, and time. Appears well-developed and well-nourished. No distress.   HENT:   Head: Normocephalic and atraumatic.   Eyes: EOM are normal. Right eye  exhibits no discharge. Left eye exhibits no discharge.   Neck: Normal range of motion. Neck supple. No tracheal deviation present.   Cardiovascular: Normal rate, regular rhythm, normal heart sounds and intact distal pulses.  Exam reveals no gallop and no friction rub.    No murmur heard.  Pulmonary/Chest: Effort normal and breath sounds normal. No respiratory distress. No wheezes. no rales. no tenderness.   Abdominal: Soft. Bowel sounds are normal. no distension and no mass. Epigastric tenderness. There is no rebound and no guarding.   Musculoskeletal:   RUE: Normal range of motion. no edema, tenderness or deformity.   LUE: Normal range of motion. no edema, tenderness or deformity.  RLE: Normal range of motion. no edema, tenderness or deformity.  LLE: Normal range of motion. no edema, tenderness or deformity.  Neurological: alert and oriented to person, place, and time.   No focal deficit   Skin: Skin is warm and dry. No rash noted. Not diaphoretic. No erythema. No pallor.   Psychiatric: normal mood and affect. Behavior is normal.   Nursing note and vitals reviewed.               PAST HISTORY         Primary Care Provider: Pcp, None, MD        PMH/PSH:    .     History reviewed. No pertinent past medical history.    He has no past surgical history on file.      Social/Family History:      He reports that he has been smoking cigarettes. He has been smoking about 0.50 packs per day. His smokeless tobacco use includes snuff. He reports current alcohol use. He reports that he does not use drugs.    History reviewed. No pertinent family history.      Listed Medications on Arrival:    .     Home Medications     Med List Status:  In Progress Set By: Idelle Crouch, RN at 08/21/2018  2:48 PM        No Medications         Allergies: He has No Known Allergies.            VISIT INFORMATION        Clinical Course in the ED:                 Medications Given in the ED:    .     ED Medication Orders (From admission, onward)    Start Ordered     Status Ordering Provider    08/21/18 1443 08/21/18 1442  sodium chloride 0.9 % bolus 1,000 mL  Once     Route: Intravenous  Ordered Dose: 1,000 mL     Last MAR action:  Stopped ROTH, ASHLEY ANN    08/21/18 1443 08/21/18 1442  ondansetron (ZOFRAN) injection 4 mg  Once     Route: Intravenous  Ordered Dose: 4 mg     Last MAR action:  Given ROTH, ASHLEY ANN    08/21/18 1443 08/21/18 1442  famotidine (PEPCID) injection 20 mg  Once     Route: Intravenous  Ordered Dose: 20 mg     Last MAR action:  Given ROTH, ASHLEY ANN            Procedures:      Procedures      Interpretations:      O2 sat-           saturation:  99 %; Oxygen use: room air; Interpretation: Normal                 RESULTS        Lab Results:      Results     Procedure Component Value Units Date/Time    Comprehensive metabolic panel [[id number]]  (Abnormal) Collected:  08/21/18 1455    Specimen:  Blood Updated:  08/21/18 1549     Glucose 90 mg/dL      BUN 7.0 mg/dL      Creatinine 0.9 mg/dL      Sodium 409 mEq/L      Potassium 4.0 mEq/L      Chloride 102 mEq/L      CO2 23 mEq/L      Calcium 9.3 mg/dL      Protein, Total 7.7  g/dL      Albumin 4.2 g/dL      AST (SGOT) 25 U/L      ALT 22 U/L      Alkaline Phosphatase 55 U/L      Bilirubin, Total 1.7 mg/dL      Globulin 3.5 g/dL      Albumin/Globulin Ratio 1.2    Narrative:       Replace urinary catheter prior to obtaining the urine culture  if it has been in place for greater than or equal to 14  days:->N/A No Foley  Indications for U/A Reflex to Micro - Reflex to  Culture:->Suprapubic Pain/Tenderness or Dysuria    Lipase [[id number]] Collected:  08/21/18 1455    Specimen:  Blood Updated:  08/21/18 1549     Lipase 10 U/L     Narrative:       Replace urinary catheter prior to obtaining the urine culture  if it has been in place for greater than or equal to 14  days:->N/A No Foley  Indications for U/A Reflex to Micro - Reflex to  Culture:->Suprapubic Pain/Tenderness or Dysuria    Ethanol (Alcohol) Level [[id number]] Collected:  08/21/18 1455    Specimen:  Blood Updated:  08/21/18 1549     Alcohol None Detected mg/dL     Narrative:       Replace urinary catheter prior to obtaining the urine culture  if it has been in place for greater than or equal to 14  days:->N/A No Foley  Indications for U/A Reflex to Micro - Reflex to  Culture:->Suprapubic Pain/Tenderness or Dysuria    GFR [[id number]] Collected:  08/21/18 1455     Updated:  08/21/18 1549     EGFR >60.0    Narrative:       Replace urinary catheter prior to obtaining the urine culture  if it has been in place for greater than or equal to 14  days:->N/A No Foley  Indications for U/A Reflex to Micro - Reflex to  Culture:->Suprapubic Pain/Tenderness or Dysuria    CBC with differential [[id number]]  (Abnormal) Collected:  08/21/18 1455    Specimen:  Blood Updated:  08/21/18 1533     WBC 6.03 x10 3/uL      Hgb 14.2 g/dL      Hematocrit 01.0 %      Platelets 244 x10 3/uL      RBC 6.03 x10 6/uL      MCV 74.6 fL      MCH 23.5 pg      MCHC 31.6 g/dL      RDW 14 %      MPV 11.4 fL  Neutrophils 72.2 %      Lymphocytes Automated 16.7 %       Monocytes 10.3 %      Eosinophils Automated 0.3 %      Basophils Automated 0.3 %      Immature Granulocyte 0.2 %      Nucleated RBC 0.0 /100 WBC      Neutrophils Absolute 4.35 x10 3/uL      Abs Lymph Automated 1.01 x10 3/uL      Abs Mono Automated 0.62 x10 3/uL      Abs Eos Automated 0.02 x10 3/uL      Absolute Baso Automated 0.02 x10 3/uL      Absolute Immature Granulocyte 0.01 x10 3/uL      Absolute NRBC 0.00 x10 3/uL     Narrative:       Replace urinary catheter prior to obtaining the urine culture  if it has been in place for greater than or equal to 14  days:->N/A No Foley  Indications for U/A Reflex to Micro - Reflex to  Culture:->Suprapubic Pain/Tenderness or Dysuria              Radiology Results:      No orders to display               Scribe Attestation:      I was acting as a Neurosurgeon for [employer], MD on [place]  Treatment Team: Scribe: Farrell Ours     I am the first provider for this patient and I personally performed the services documented. Treatment Team: Scribe: [place], Ree Kida is scribing for me on [facility]. This note and the patient instructions accurately reflect work and decisions made by me.  Izetta Dakin, MD          Izetta Dakin, MD  08/21/18 1610       Izetta Dakin, MD  08/21/18 [phone]

## 2018-08-30 ENCOUNTER — Emergency Department: Admission: EM | Admit: 2018-08-30 | Payer: Self-pay | Source: Home / Self Care

## 2018-08-30 ENCOUNTER — Emergency Department
Admission: EM | Admit: 2018-08-30 | Discharge: 2018-08-30 | Disposition: A | Discharge status: Home / Self Care | Payer: Charity | Attending: Emergency Medicine | Admitting: Emergency Medicine

## 2018-08-30 DIAGNOSIS — H8111 Benign paroxysmal vertigo, right ear: Secondary | ICD-10-CM | POA: Insufficient documentation

## 2018-08-30 DIAGNOSIS — F17210 Nicotine dependence, cigarettes, uncomplicated: Secondary | ICD-10-CM | POA: Insufficient documentation

## 2018-08-30 MED ORDER — MECLIZINE HCL 25 MG PO TABS
25.00 mg | ORAL_TABLET | Freq: Three times a day (TID) | ORAL | 0 refills | Status: DC | PRN
Start: 2018-08-30 — End: 2020-02-27

## 2018-08-30 MED ORDER — MECLIZINE HCL 12.5 MG PO TABS
25.00 mg | ORAL_TABLET | Freq: Once | ORAL | Status: AC
Start: 2018-08-30 — End: 2018-08-30
  Administered 2018-08-30: 21:00:00 25 mg via ORAL
  Filled 2018-08-30: qty 2

## 2018-08-30 NOTE — ED Triage Notes (Addendum)
Pt arrived to ED by car.    Pt presents to the ED with dizziness/ vertigo that started yesterday. Pt denies any headache, nausea, or emesis.    Pt is alert and oriented x4. Pt is currently walking to stretcher.

## 2018-08-30 NOTE — ED Provider Notes (Signed)
EMERGENCY DEPARTMENT HISTORY AND PHYSICAL EXAM     Physician/Midlevel provider first contact with patient: 08/30/18 2029         Date: 08/30/2018  Patient Name: Eric [name]    History of Presenting Illness     Chief Complaint   Patient presents with   . Dizziness       History Provided By: Patient    Chief Complaint: Dizziness  Onset: 2 days ago  Timing: Intermittent  Location: Systemic  Quality: room spinning  Radiation (for pain--to where): none  Severity: Moderate  Exacerbating factors: going from sitting to standing  Alleviating factors: none  Associated Symptoms: Right ear pain  Pertinent Negatives: no recent swimming, ear drainage, headache, speech difficulty, vision changes, weakness or numbness, fever, vomiting, LOC, fall, recent travel    Additional History: Eric [name] is a 27 y.o. male c/o 2 days of intermittent "room-spinning" dizziness with associated R ear pain.  Pt states the dizziness is worse when he goes from sitting to standing and describes his R ear pain as "hot."  Denies  recent swimming, ear drainage, headache, speech difficulty, vision changes, weakness or numbness, fever, vomiting, LOC, fall, recent travel.      PCP: Pcp, None, MD    No current facility-administered medications for this encounter.      Current Outpatient Medications   Medication Sig Dispense Refill   . meclizine (ANTIVERT) 25 MG tablet Take 1 tablet (25 mg total) by mouth 3 (three) times daily as needed for Dizziness 30 tablet 0   . ondansetron (ZOFRAN-ODT) 4 MG disintegrating tablet Take 1 tablet (4 mg total) by mouth every 6 (six) hours as needed for Nausea 4 tablet 0       Past History     Past Medical History:  History reviewed. No pertinent past medical history.    Past Surgical History:  History reviewed. No pertinent surgical history.    Family History:  History reviewed. No pertinent family history.    Social History:  Social History     Tobacco Use   . Smoking status: Current Every Day Smoker     Packs/day: 0.50      Types: Cigarettes   . Smokeless tobacco: Current User     Types: Snuff   Substance Use Topics   . Alcohol use: Yes     Comment: once a week   . Drug use: No       Allergies:  No Known Allergies    Review of Systems     Review of Systems   Constitutional: Negative for chills and fever.   HENT: Positive for ear pain. Negative for congestion, sore throat, trouble swallowing and voice change.    Eyes: Negative for redness and visual disturbance.   Respiratory: Negative for cough, chest tightness and shortness of breath.    Cardiovascular: Negative for chest pain, palpitations and leg swelling.   Gastrointestinal: Negative for abdominal pain, nausea and vomiting.   Endocrine: Negative for polydipsia and polyuria.   Genitourinary: Negative for dysuria, flank pain, hematuria and urgency.   Musculoskeletal: Negative for back pain and neck pain.   Skin: Negative for color change and rash.   Allergic/Immunologic: Negative for immunocompromised state.   Neurological: Positive for dizziness. Negative for syncope, weakness and numbness.   Hematological: Negative for adenopathy.   Psychiatric/Behavioral: Negative for agitation and confusion.   All other systems reviewed and are negative.      Physical Exam   BP 123/74   Pulse 98  Temp 98.1 F (36.7 C) (Oral)   Resp 18   SpO2 100%     Physical Exam   Constitutional: He is oriented to person, place, and time. He appears well-developed and well-nourished. No distress.   Anxious but nontoxic.  Well-appearing.   HENT:   Head: Normocephalic and atraumatic.   Right Ear: Tympanic membrane normal. No drainage.   Left Ear: Tympanic membrane normal. No drainage.   Mouth/Throat: Oropharynx is clear and moist.   No ear canal edema, no drainage, TMs intact bilaterally.  Small amount of cerumen in R ear canal.  No mastoid erythema, edema, TTP or induration bilaterally.   Eyes: Pupils are equal, round, and reactive to light. Conjunctivae are normal.   Neck: Normal range of motion. Neck  supple. No JVD present.   Cardiovascular: Normal rate, regular rhythm, normal heart sounds and intact distal pulses.   Pulmonary/Chest: Effort normal and breath sounds normal. No respiratory distress.   Abdominal: Soft. Bowel sounds are normal. He exhibits no distension. There is no tenderness.   Musculoskeletal: Normal range of motion.         General: No tenderness or edema.   Lymphadenopathy:     He has no cervical adenopathy.   Neurological: He is alert and oriented to person, place, and time. No cranial nerve deficit. He exhibits normal muscle tone. GCS eye subscore is 4. GCS verbal subscore is 5. GCS motor subscore is 6.   No focal neuro deficits.  Dix-Hallpike positive on R side.   Skin: Skin is warm and dry. No rash noted. He is not diaphoretic.   Psychiatric: He has a normal mood and affect. His behavior is normal.   Nursing note and vitals reviewed.    Diagnostic Study Results     Labs -     Results     ** No results found for the last 24 hours. **          Radiologic Studies -   Radiology Results (24 Hour)     ** No results found for the last 24 hours. **      .      Medical Decision Making   I am the first provider for this patient.    I reviewed the vital signs, available nursing notes, past medical history, past surgical history, family history and social history.    Vital Signs: Reviewed the patient's vital signs.     Patient Vitals for the past 12 hrs:   BP Temp Pulse Resp   08/30/18 2059 123/74 98.1 F (36.7 C) 98 18       Pulse Oximetry Analysis:  100% on RA    Record Review: Nursing notes.     ED Course:     8:45 PM - Discussed meclizine in the ED, patient agrees.  Patient speaks Arabic and some [place].  Arabic interpreter offered several times but patient repeatedly declined.    8:56 PM - Counseled on diagnosis, f/u plans, medication use, signs and symptoms when to return to ED.  Pt is stable and ready for discharge.       9:14 PM - Pt requested repetition of discharge instructions with his  friend translating to Arabic.  All of patient's questions were answered and concerns addressed.     Provider Notes: 61M with dizziness and right ear discomfort as above.  Patient afebrile and HDS.  No evidence of infectious process.  Exam with positive Dix-Hallpike maneuver on the right.  Right ear canal with  normal TM, no evidence of perforation.  Patient received meclizine in the ED.  Low suspicion for cerebrovascular etiology for symptoms.  Discussed information with patient and reassured. Pt is comfortable going home. Rx for meclizine provided. Talked about symptoms that would prompt a return to the ED and patient expressed understanding. Pt discharged with vertigo clinic follow up. Pt agrees with the plan and is comfortable with discharge from the ED.      Diagnosis     Clinical Impression:   1. Benign paroxysmal positional vertigo of right ear        Treatment Plan:   ED Disposition     ED Disposition Condition Date/Time Comment    Discharge  Mon Aug 30, 2018  8:52 PM Jarick Verry discharge to home/self care.    Condition at disposition: Stable            _______________________________    Attestations: This note is prepared by Mathis Fare acting as scribe for Ardis Hughs, MD, PHD. The scribe's documentation has been prepared under my direction and personally reviewed by me in its entirety.  I confirm that the note above accurately reflects all work, treatment, procedures, and medical decision making performed by me.    _______________________________              Juliane Poot, MD PhD  09/03/18 [phone]

## 2018-09-03 NOTE — Discharge Instructions (Signed)
Thank you for allowing [place] the privilege of taking care of you. Our goal is to provide you with EXCELLENT care. I hope we were able to accomplish that goal.    If there are any questions or concerns at all, we are always a resource for you.     You can contact us at the following numbers:  [facility]: [phone]  [facility]: [phone]    Please know that the diagnosis in the emergency department is often preliminary and a specific diagnosis cannot always be made. Every disease has a progression and may take time before it is recognized. It is very important to return to the ER or see you primary care doctor if you do not improve and especially if your symptoms worsen or change. IF YOU SENSE SOMETHING IS NOT RIGHT, DO NOT HESITATE TO RETURN TO THE ER.    You can try the following to help with ear wax (do one ear at a time):    - mix white vinegar and alcohol in equal parts  - lie down with your ear pointed to the ceiling and put a teaspoon of the mixture in the ear  - wait 1-2 minutes  - sit up or turn your head so that your ear now faces towards the floor  - let the mixture run out of the ear and wipe with a clean cloth or towel  - repeat for the other ear    Vertigo    You have been diagnosed with vertigo.    Vertigo means "the feeling of spinning." People with vertigo have an intense feeling that the room is spinning. This is often called "dizziness."    Most of the time the cause of vertigo is not serious. The most common cause is a balance problem in the inner ear.    The usual treatment is medication to help relieve the spinning feeling and to control nausea.    Your type of vertigo is called benign positional vertigo. This type of vertigo results from crystals (called otoliths) in the canals in the inner ear. The canals are fluid-filled tubes that help with balance. When the crystals move within the canals, it interferes with nerve signals and sends wrong information about  balance. This causes nystagmus (abnormal eye movement) and the feeling of spinning.   This type of vertigo can last up to 6 weeks but usually improves sooner. Medication can help. Your vertigo is not dangerous but it can be inconvenient.    DO NOT drive a motor vehicle or operate any other equipment that requires concentration until your symptoms have resolved. Be very careful going up and down stairs.    If your symptoms continue your doctor may order an MRI of your brain to make sure there is not a more serious cause of your vertigo.    YOU SHOULD SEEK MEDICAL ATTENTION IMMEDIATELY, EITHER HERE OR AT THE NEAREST EMERGENCY DEPARTMENT, IF ANY OF THE FOLLOWING OCCURS:   You feel numbness, tingling, or weakness in your arms or legs or become unable to walk.   Your symptoms become worse, even with medication.   You have a severe headache.   You have vomiting that makes it hard to take or keep down medication.

## 2019-04-07 IMAGING — DX DG ANKLE COMPLETE 3+V*L*
3 series · 3 of 3 positions shown · non-contrast
Comparison: None.

CLINICAL DATA: Ankle injury

EXAM:
LEFT ANKLE COMPLETE - 3+ VIEW

[ankle ap]
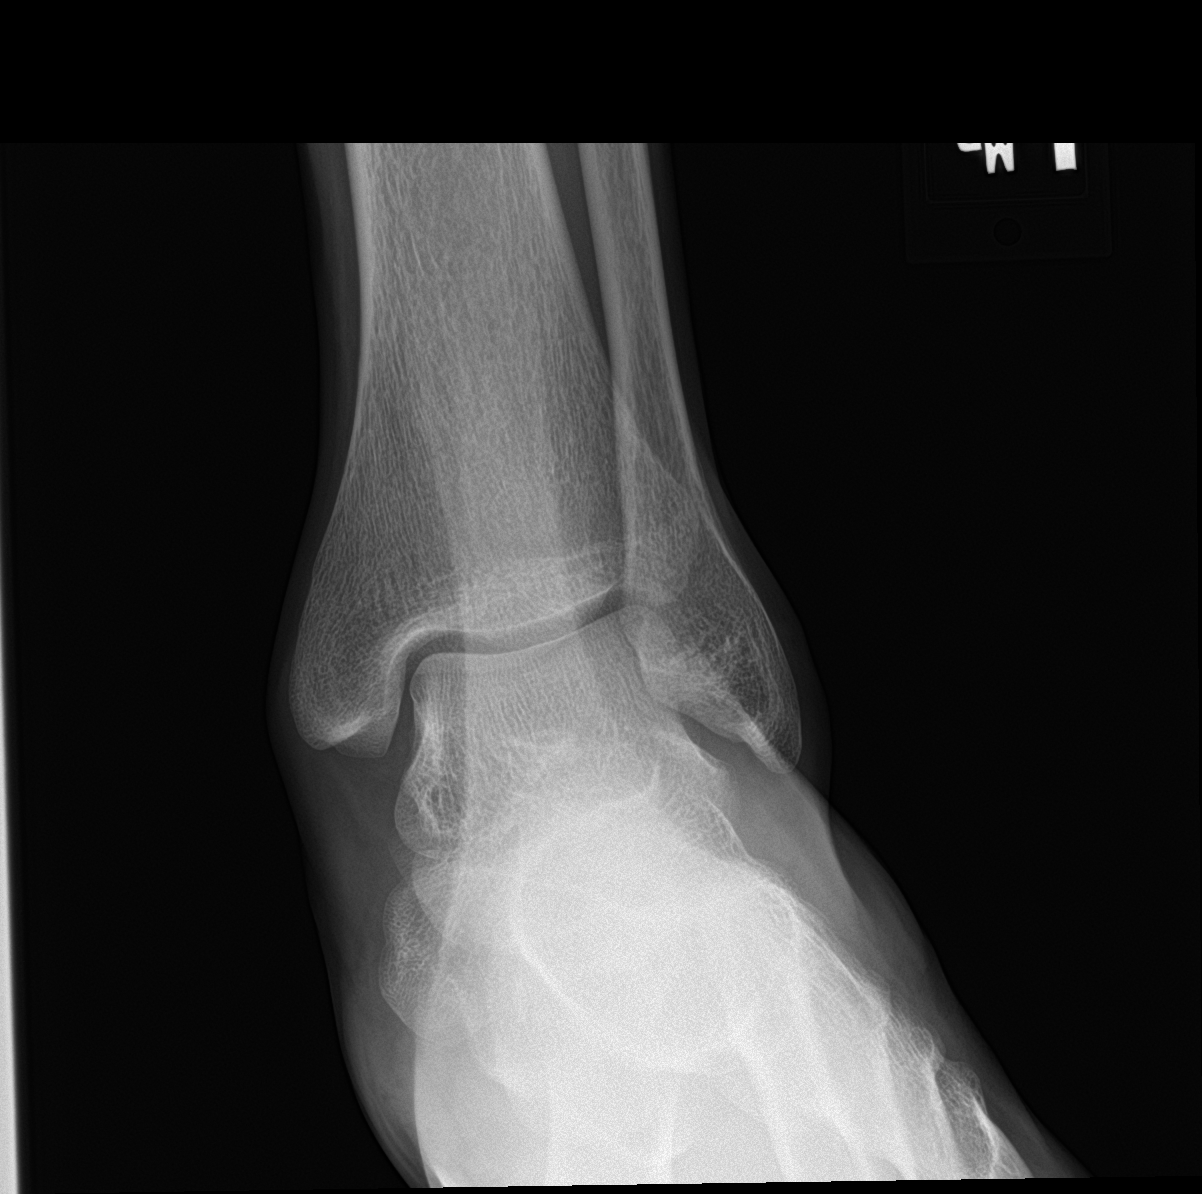

[ankle obl]
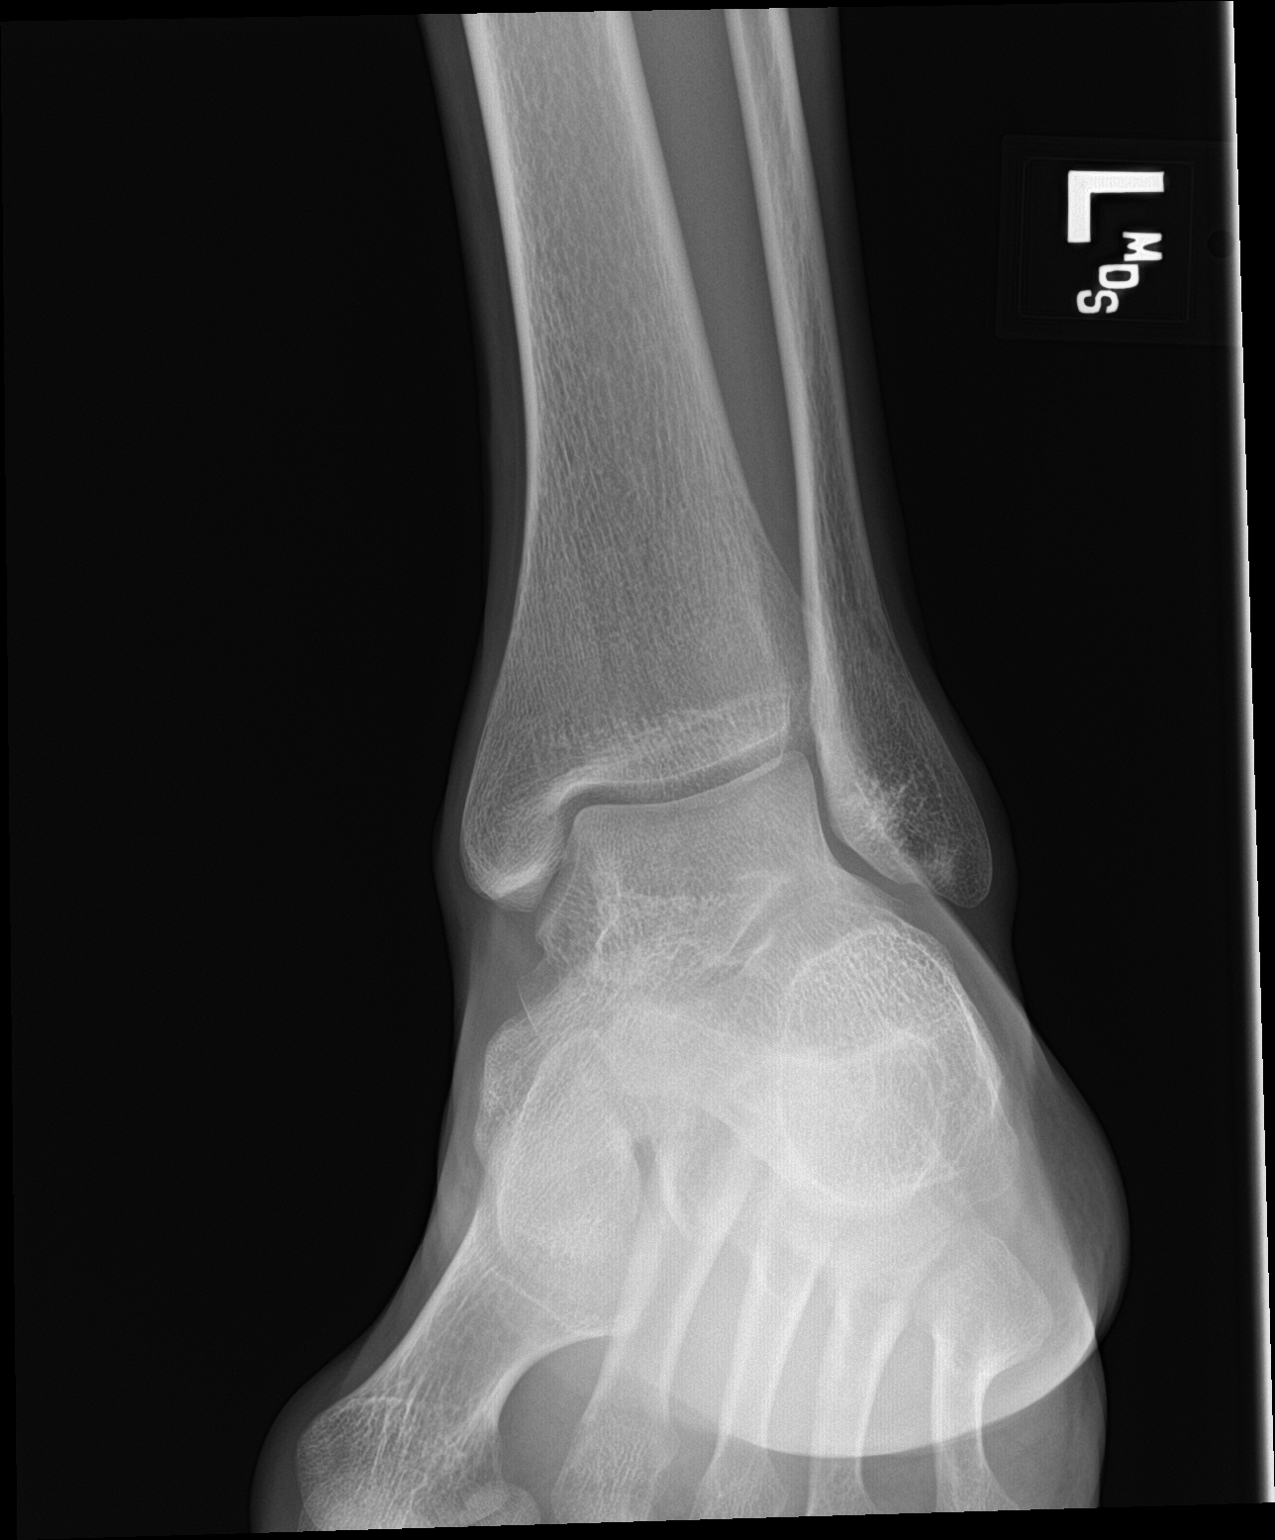

[ankle lat]
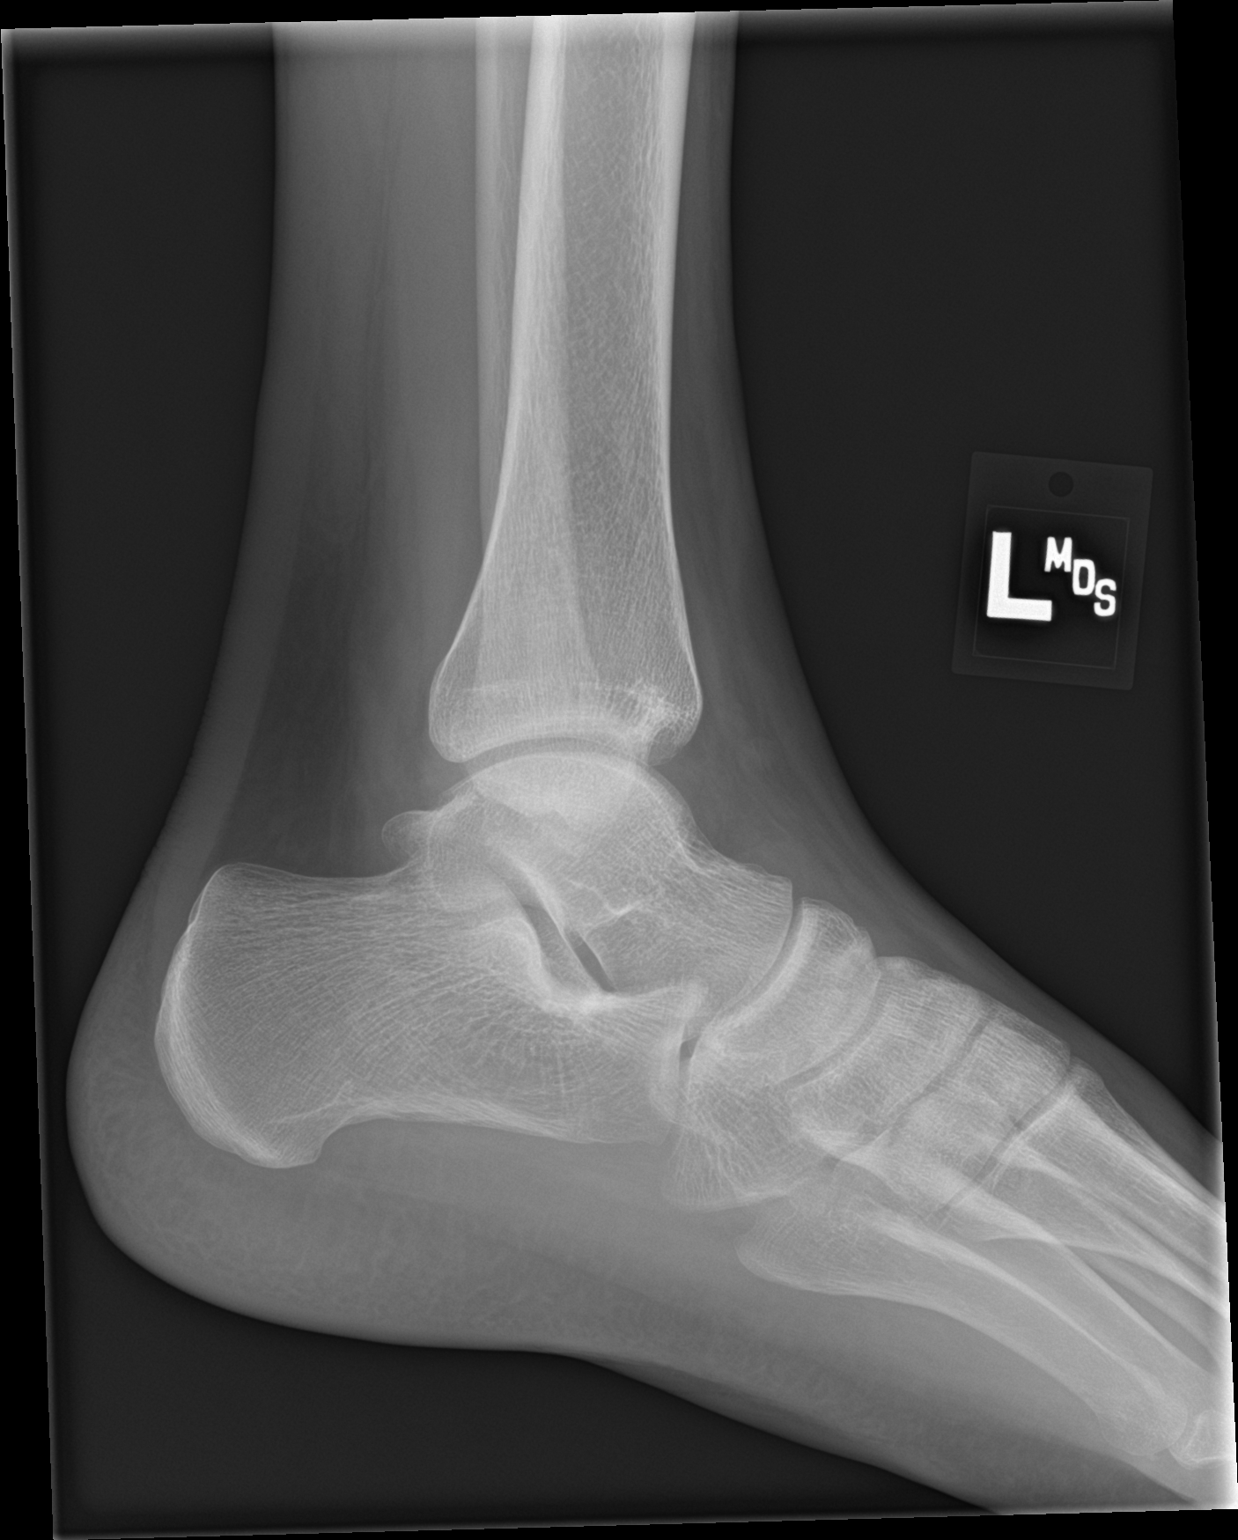

[3 of 3 positions shown; findings below may reference images not displayed]

FINDINGS: There is no evidence of fracture, dislocation, or joint effusion.
There is no evidence of arthropathy or other focal bone abnormality.
Soft tissues are unremarkable.
IMPRESSION: No acute abnormality of the left ankle.

## 2019-05-02 ENCOUNTER — Emergency Department
Admission: EM | Admit: 2019-05-02 | Discharge: 2019-05-02 | Disposition: A | Discharge status: Home / Self Care | Payer: Charity | Attending: Emergency Medicine | Admitting: Emergency Medicine

## 2019-05-02 DIAGNOSIS — R197 Diarrhea, unspecified: Secondary | ICD-10-CM | POA: Insufficient documentation

## 2019-05-02 DIAGNOSIS — R1084 Generalized abdominal pain: Secondary | ICD-10-CM | POA: Insufficient documentation

## 2019-05-02 HISTORY — DX: Disease of intestine, unspecified: K63.9

## 2019-05-02 LAB — CBC AND DIFFERENTIAL
Absolute NRBC: 0 10*3/uL (ref 0.00–0.00)
Absolute NRBC: 0 10*3/uL (ref 0.00–0.00)
Basophils Absolute Automated: 0.03 10*3/uL (ref 0.00–0.08)
Basophils Absolute Automated: 0.03 10*3/uL (ref 0.00–0.08)
Basophils Automated: 0.3 %
Basophils Automated: 0.3 %
Eosinophils Absolute Automated: 0.09 10*3/uL (ref 0.00–0.44)
Eosinophils Absolute Automated: 0.09 10*3/uL (ref 0.00–0.44)
Eosinophils Automated: 1 %
Eosinophils Automated: 1 %
Hematocrit: 43.8 % (ref 37.6–49.6)
Hematocrit: 40.9 % (ref 37.6–49.6)
Hgb: 13.7 g/dL (ref 12.5–17.1)
Hgb: 12.9 g/dL (ref 12.5–17.1)
Immature Granulocytes Absolute: 0.02 10*3/uL (ref 0.00–0.07)
Immature Granulocytes Absolute: 0.02 10*3/uL (ref 0.00–0.07)
Immature Granulocytes: 0.2 %
Immature Granulocytes: 0.2 %
Lymphocytes Absolute Automated: 1.95 10*3/uL (ref 0.42–3.22)
Lymphocytes Absolute Automated: 1.95 10*3/uL (ref 0.42–3.22)
Lymphocytes Automated: 20.9 %
Lymphocytes Automated: 21.8 %
MCH: 22.9 pg — ABNORMAL LOW (ref 25.1–33.5)
MCH: 23.3 pg — ABNORMAL LOW (ref 25.1–33.5)
MCHC: 31.3 g/dL — ABNORMAL LOW (ref 31.5–35.8)
MCHC: 31.5 g/dL (ref 31.5–35.8)
MCV: 73.4 fL — ABNORMAL LOW (ref 78.0–96.0)
MCV: 73.8 fL — ABNORMAL LOW (ref 78.0–96.0)
MPV: 11.2 fL (ref 8.9–12.5)
MPV: 11.5 fL (ref 8.9–12.5)
Monocytes Absolute Automated: 0.58 10*3/uL (ref 0.21–0.85)
Monocytes Absolute Automated: 0.58 10*3/uL (ref 0.21–0.85)
Monocytes: 6.2 %
Monocytes: 6.5 %
Neutrophils Absolute: 6.64 10*3/uL — ABNORMAL HIGH (ref 1.10–6.33)
Neutrophils Absolute: 6.26 10*3/uL (ref 1.10–6.33)
Neutrophils: 71.4 %
Neutrophils: 70.2 %
Nucleated RBC: 0 /100 WBC (ref 0.0–0.0)
Nucleated RBC: 0 /100 WBC (ref 0.0–0.0)
Platelets: 289 10*3/uL (ref 142–346)
Platelets: 245 10*3/uL (ref 142–346)
RBC: 5.97 10*6/uL — ABNORMAL HIGH (ref 4.20–5.90)
RBC: 5.54 10*6/uL (ref 4.20–5.90)
RDW: 15 % (ref 11–15)
RDW: 15 % (ref 11–15)
WBC: 9.31 10*3/uL (ref 3.10–9.50)
WBC: 8.93 10*3/uL (ref 3.10–9.50)

## 2019-05-02 LAB — URINALYSIS REFLEX TO MICROSCOPIC EXAM - REFLEX TO CULTURE
Bilirubin, UA: NEGATIVE
Bilirubin, UA: NEGATIVE
Blood, UA: NEGATIVE
Blood, UA: NEGATIVE
Glucose, UA: NEGATIVE
Glucose, UA: NEGATIVE
Ketones UA: NEGATIVE
Ketones UA: NEGATIVE
Leukocyte Esterase, UA: NEGATIVE
Leukocyte Esterase, UA: NEGATIVE
Nitrite, UA: NEGATIVE
Nitrite, UA: NEGATIVE
Protein, UR: NEGATIVE
Protein, UR: NEGATIVE
Specific Gravity UA: 1.006 (ref 1.001–1.035)
Specific Gravity UA: 1.006 (ref 1.001–1.035)
Urine pH: 8 (ref 5.0–8.0)
Urine pH: 8 (ref 5.0–8.0)
Urobilinogen, UA: NORMAL mg/dL (ref 0.2–2.0)
Urobilinogen, UA: NORMAL mg/dL (ref 0.2–2.0)

## 2019-05-02 LAB — COMPREHENSIVE METABOLIC PANEL
ALT: 11 U/L (ref 0–55)
ALT: 11 U/L (ref 0–55)
AST (SGOT): 16 U/L (ref 5–34)
AST (SGOT): 17 U/L (ref 5–34)
Albumin/Globulin Ratio: 1.1 (ref 0.9–2.2)
Albumin/Globulin Ratio: 1.2 (ref 0.9–2.2)
Albumin: 4 g/dL (ref 3.5–5.0)
Albumin: 3.6 g/dL (ref 3.5–5.0)
Alkaline Phosphatase: 56 U/L (ref 38–106)
Alkaline Phosphatase: 50 U/L (ref 38–106)
Anion Gap: 9 (ref 5.0–15.0)
Anion Gap: 8 (ref 5.0–15.0)
BUN: 7 mg/dL — ABNORMAL LOW (ref 9.0–28.0)
BUN: 8 mg/dL — ABNORMAL LOW (ref 9.0–28.0)
Bilirubin, Total: 1.1 mg/dL (ref 0.2–1.2)
Bilirubin, Total: 1 mg/dL (ref 0.2–1.2)
CO2: 26 mEq/L (ref 22–29)
CO2: 26 mEq/L (ref 22–29)
Calcium: 9.1 mg/dL (ref 8.5–10.5)
Calcium: 8.5 mg/dL (ref 8.5–10.5)
Chloride: 105 mEq/L (ref 100–111)
Chloride: 105 mEq/L (ref 100–111)
Creatinine: 1 mg/dL (ref 0.7–1.3)
Creatinine: 0.9 mg/dL (ref 0.7–1.3)
Globulin: 3.5 g/dL (ref 2.0–3.6)
Globulin: 3.1 g/dL (ref 2.0–3.6)
Glucose: 90 mg/dL (ref 70–100)
Glucose: 96 mg/dL (ref 70–100)
Potassium: 3.8 mEq/L (ref 3.5–5.1)
Potassium: 3.7 mEq/L (ref 3.5–5.1)
Protein, Total: 7.5 g/dL (ref 6.0–8.3)
Protein, Total: 6.7 g/dL (ref 6.0–8.3)
Sodium: 140 mEq/L (ref 136–145)
Sodium: 139 mEq/L (ref 136–145)

## 2019-05-02 LAB — LIPASE
Lipase: 22 U/L (ref 8–78)
Lipase: 20 U/L (ref 8–78)

## 2019-05-02 LAB — GFR
EGFR: >60
EGFR: >60

## 2019-05-02 LAB — LACTIC ACID, PLASMA: Lactic Acid: 1.3 mmol/L (ref 0.2–2.0)

## 2019-05-02 MED ORDER — ONDANSETRON HCL 4 MG/2ML IJ SOLN
4.00 mg | Freq: Once | INTRAMUSCULAR | Status: AC
Start: 2019-05-02 — End: 2019-05-02
  Administered 2019-05-02: 21:00:00 4 mg via INTRAVENOUS
  Filled 2019-05-02: qty 2

## 2019-05-02 MED ORDER — SODIUM CHLORIDE 0.9 % IV BOLUS
1000.00 mL | Freq: Once | INTRAVENOUS | Status: AC
Start: 2019-05-02 — End: 2019-05-02
  Administered 2019-05-02: 21:00:00 1000 mL via INTRAVENOUS

## 2019-05-02 NOTE — ED Provider Notes (Signed)
[facility] EMERGENCY DEPARTMENT H&P         CLINICAL SUMMARY          Diagnosis:    .     Final diagnoses:   Diarrhea, unspecified type   Generalized abdominal pain         MDM Notes:      - At discharge pt appeared well, in no distress, and a good candidate for outpatient follow up.No signs of toxicity to suggest need for further labs, imaging, or admission.  - Results were discussed with patient and all questions were answered.  - Pt advised to follow printed ED return precautions and to return for any new or worsening symptoms.    Pt's sxs most likely due to gastroenteritis. He sts he has a family hx of what seems to be gastric ulcers, so I have given him a referral to [facility] for further evaluation.       Disposition:         Discharge           I was present for the bedside discharge alongside the patient's ED nurse. Course of visit in the ED and discharge instructions were reviewed with patient and they were given the opportunity to ask any questions regarding their care today. Patient and/ or patient's family verbalized understanding of, and comfort with, instructions and plan.          Discharge Prescriptions     None                       CLINICAL INFORMATION        HPI:      Chief Complaint: Abdominal Pain  .    Sante Holleran is a 28 y.o. male  has a past medical history of Colon abnormality. who presents with diffuse abdominal pain and several episodes of non-bloody/non-melena diarrhea since yesterday. Abdominal pain is now resolved, although still has some diarrhea. Denies any other sxs. No recent abx use. Denies fevers, chills, sweats, cough, SOB, chest pain, myalgias, dizziness, or known sick contacts.     History obtained from: patient, review of prior chart      ROS:      Positive and negative ROS elements as per HPI.  All other systems reviewed and negative.    Interpretations:              Physical Exam:      Pulse (!) 103   BP 120/81   Resp 18   SpO2 97 %   Temp 98.4 F  (36.9 C)    Constitutional: Vital signs reviewed. Well appearing. No apparent distress.  Head: Normocephalic, atraumatic.  Eyes: EOMI, PERRLA, No conjunctival injection. No discharge.  ENT: Mucous membranes moist. Normal appearing oropharynx. No swelling.  Neck: Normal range of motion. Non-tender. No mass appreciated.  Respiratory/Chest: Clear to auscultation. No respiratory distress.   Cardiovascular: Regular rate and rhythm. No murmur.   Abdomen: Soft, non-tender. No guarding. Non-distended. No masses or hepatosplenomegaly.  Genitourinary: No cva tenderness.  Upper Extremities: No edema. No cyanosis.  Lower Extremities: No edema. No cyanosis.  Neurological: No focal motor deficits by observation. Speech normal. Memory normal.  Skin: Warm and dry. No rash.  Lymphatic: No cervical lymphadenopathy.  Psychiatric: Normal affect. Normal concentration.              PAST HISTORY        Primary Care Provider: Pcp, None, MD  PMH/PSH:    .     Past Medical History:   Diagnosis Date    Colon abnormality        He has no past surgical history on file.      Social/Family History:      He reports that he has never smoked. He has never used smokeless tobacco. He reports current alcohol use. He reports that he does not use drugs.    History reviewed. No pertinent family history.      Listed Medications on Arrival:    .     Previous Medications    No medications on file      Allergies: He has No Known Allergies.            VISIT INFORMATION        Clinical Course in the ED:      Throughout the stay in the Emergency Department, questions and concerns surrounding pain management, care plan, diagnostic studies, effects of medications administered or prescribed, and future care plan were assessed and addressed.           Medications Given in the ED:    .     ED Medication Orders (From admission, onward)    Start Ordered     Status Ordering Provider    05/02/19 2024 05/02/19 2024  ondansetron (ZOFRAN) injection 4 mg  Once      Route: Intravenous  Ordered Dose: 4 mg     Last MAR action:  Given Delayna Sparlin Q    05/02/19 2024 05/02/19 2024  sodium chloride 0.9 % bolus 1,000 mL  Once     Route: Intravenous  Ordered Dose: 1,000 mL     Last MAR action:  New Bag Sidney Kann Q            Procedures:      Procedures            RESULTS        Lab Results:      Results     Procedure Component Value Units Date/Time    CBC and differential [[id number]] Collected:  05/02/19 2038    Specimen:  Blood Updated:  05/02/19 2138    Narrative:       Replace urinary catheter prior to obtaining the urine culture  if it has been in place for greater than or equal to 14  days:->N/A No Foley  Indications for U/A Reflex to Micro - Reflex to  Culture:->Suprapubic Pain/Tenderness or Dysuria    Comprehensive metabolic panel [[id number]] Collected:  05/02/19 2038    Specimen:  Blood Updated:  05/02/19 2138    Narrative:       Replace urinary catheter prior to obtaining the urine culture  if it has been in place for greater than or equal to 14  days:->N/A No Foley  Indications for U/A Reflex to Micro - Reflex to  Culture:->Suprapubic Pain/Tenderness or Dysuria    Lipase [[id number]] Collected:  05/02/19 2038    Specimen:  Blood Updated:  05/02/19 2138    Narrative:       Replace urinary catheter prior to obtaining the urine culture  if it has been in place for greater than or equal to 14  days:->N/A No Foley  Indications for U/A Reflex to Micro - Reflex to  Culture:->Suprapubic Pain/Tenderness or Dysuria    UA Reflex to Micro - Reflex to Culture [[id number]] Collected:  05/02/19 2038    Specimen:  Urine, Clean Catch Updated:  05/02/19  2138    Narrative:       Replace urinary catheter prior to obtaining the urine culture  if it has been in place for greater than or equal to 14  days:->N/A No Foley  Indications for U/A Reflex to Micro - Reflex to  Culture:->Suprapubic Pain/Tenderness or Dysuria    GFR [[id number]] Collected:  05/02/19 2011     Updated:  05/02/19 2050      EGFR >60.0    Narrative:       Replace urinary catheter prior to obtaining the urine culture  if it has been in place for greater than or equal to 14  days:->N/A No Foley  Indications for U/A Reflex to Micro - Reflex to  Culture:->Suprapubic Pain/Tenderness or Dysuria    Comprehensive metabolic panel [[id number]]  (Abnormal) Collected:  05/02/19 2011    Specimen:  Blood Updated:  05/02/19 2050     Glucose 90 mg/dL      BUN 7.0 mg/dL      Creatinine 1.0 mg/dL      Sodium 782 mEq/L      Potassium 3.8 mEq/L      Chloride 105 mEq/L      CO2 26 mEq/L      Calcium 9.1 mg/dL      Protein, Total 7.5 g/dL      Albumin 4.0 g/dL      AST (SGOT) 16 U/L      ALT 11 U/L      Alkaline Phosphatase 56 U/L      Bilirubin, Total 1.1 mg/dL      Globulin 3.5 g/dL      Albumin/Globulin Ratio 1.1     Anion Gap 9.0    Narrative:       Replace urinary catheter prior to obtaining the urine culture  if it has been in place for greater than or equal to 14  days:->N/A No Foley  Indications for U/A Reflex to Micro - Reflex to  Culture:->Suprapubic Pain/Tenderness or Dysuria    Lipase [[id number]] Collected:  05/02/19 2011    Specimen:  Blood Updated:  05/02/19 2050     Lipase 22 U/L     Narrative:       Replace urinary catheter prior to obtaining the urine culture  if it has been in place for greater than or equal to 14  days:->N/A No Foley  Indications for U/A Reflex to Micro - Reflex to  Culture:->Suprapubic Pain/Tenderness or Dysuria    UA Reflex to Micro - Reflex to Culture [[id number]] Collected:  05/02/19 2011    Specimen:  Urine, Clean Catch Updated:  05/02/19 2038     Urine Type Urine, Clean Ca     Color, UA Straw     Clarity, UA Clear     Specific Gravity UA 1.006     Urine pH 8.0     Leukocyte Esterase, UA Negative     Nitrite, UA Negative     Protein, UR Negative     Glucose, UA Negative     Ketones UA Negative     Urobilinogen, UA Normal mg/dL      Bilirubin, UA Negative     Blood, UA Negative    Narrative:       Replace urinary  catheter prior to obtaining the urine culture  if it has been in place for greater than or equal to 14  days:->N/A No Foley  Indications for U/A Reflex to Micro - Reflex to  Culture:->Suprapubic Pain/Tenderness or Dysuria  CBC and differential [[id number]]  (Abnormal) Collected:  05/02/19 2011    Specimen:  Blood Updated:  05/02/19 2034     WBC 9.31 x10 3/uL      Hgb 13.7 g/dL      Hematocrit 40.9 %      Platelets 289 x10 3/uL      RBC 5.97 x10 6/uL      MCV 73.4 fL      MCH 22.9 pg      MCHC 31.3 g/dL      RDW 15 %      MPV 11.2 fL      Neutrophils 71.4 %      Lymphocytes Automated 20.9 %      Monocytes 6.2 %      Eosinophils Automated 1.0 %      Basophils Automated 0.3 %      Immature Granulocyte 0.2 %      Nucleated RBC 0.0 /100 WBC      Neutrophils Absolute 6.64 x10 3/uL      Abs Lymph Automated 1.95 x10 3/uL      Abs Mono Automated 0.58 x10 3/uL      Abs Eos Automated 0.09 x10 3/uL      Absolute Baso Automated 0.03 x10 3/uL      Absolute Immature Granulocyte 0.02 x10 3/uL      Absolute NRBC 0.00 x10 3/uL     Narrative:       Replace urinary catheter prior to obtaining the urine culture  if it has been in place for greater than or equal to 14  days:->N/A No Foley  Indications for U/A Reflex to Micro - Reflex to  Culture:->Suprapubic Pain/Tenderness or Dysuria    Lactic acid, plasma [[id number]] Collected:  05/02/19 2011    Specimen:  Blood Updated:  05/02/19 2022     Lactic acid 1.3 mmol/L               Radiology Results:      No orders to display               Scribe Attestation:      I was acting as a Neurosurgeon for Maceo Pro, MD on [place]  Treatment Team: Scribe: Margo Common     I am the first provider for this patient and I personally performed the services documented. Treatment Team: Scribe: Margo Common is scribing for me on [facility]. This note and the patient instructions accurately reflect work and decisions made by me.  Maceo Pro, MD           Maceo Pro, MD  05/06/19 [phone]

## 2019-05-02 NOTE — Discharge Instructions (Signed)
Dear [name]:    Thank you for choosing the [facility] Emergency Department, the premier emergency department in the [place] area.  I hope your visit today was EXCELLENT.    Specific instructions for your visit today:      Abdominal Pain    You have been diagnosed with abdominal (belly) pain. The cause of your pain is not yet known.    Many things can cause abdominal pain such as infections and bowel (intestine) spasms. You might need another examination or more tests to find out why you have pain.    At this time, your pain does not seem to be caused by anything dangerous. You do not need surgery. You do not need to stay in the hospital.     Though we dont believe your condition is dangerous right now, it is important to be careful. Sometimes a problem that seems mild now can become serious later. If you do not get completely better or your symptoms get worse, you should seek more care. This is why it is important that you get additional help unless you are 100% improved    Follow up with your regular doctor in:   24 hours.    For the next 24 hours, Drink only clear liquids such as:   Water.   Clear broth.   Sports drinks.   Clear caffeine-free soft drinks, like 7-Up or Sprite.    Return here or go to the nearest Emergency Department immediately if:   Your pain does not go away or gets worse.   You cannot keep fluids down    Your vomit (throw up) is dark green.    You vomit (throw up) blood or see blood in your stool (poop). Blood might be bright red or dark red. It can also be black and look like tar.   You have a fever (temperature higher than 100.152F / 38C) or shaking chills.   Your skin or eyes look yellow.   Your urine looks brown.   You have severe diarrhea.    If you can't follow up with your doctor, or if at any time you feel you need to be rechecked or seen again, come back here or go to the nearest emergency department.               Diarrhea    You have been diagnosed  with diarrhea.    You have diarrhea when you have stools (bowel movements) that are soft or liquid, or when you have too many stools in a day. You may also have stomach cramps/ pains, nausea (feeling sick to your stomach), vomiting and fever (temperature higher than 100.152F / 38C).    Diarrhea can be caused by bacteria, viruses and parasites. People sometimes get traveler's diarrhea. They get it when they go to other countries. It is caused by bacteria.    People with diarrhea often lose a lot of body fluids. This causes dehydration. Drink a lot of water or other fluids to stay hydrated. You may also be given medicine for your diarrhea. It will slow the diarrhea and stop the vomiting (if you have this symptom).    You should drink lots of natural juices or a sports-type drink that has electrolytes (sodium, potassium, etc). Do not drink a lot of plain water. Sugary drinks like apple and pear juice might make the diarrhea worse.    You might be given medicine to help you with your diarrhea, nausea (feeling sick),  vomiting and stomach cramps. This will depend on your age, medical history and symptoms.    It is OK for you to go home today.    Good hygiene (keeping clean) helps to keep the problem from spreading. Please wash your hands often, using soap and water, especially after using the bathroom. Do not prepare food or share food, drinks or utensils (forks, knives, etc.) with other people.    It is very important that you follow up with your regular doctor or a specialist. The doctor will tell you how soon this follow-up needs to be.    YOU SHOULD SEEK MEDICAL ATTENTION IMMEDIATELY, EITHER HERE OR AT THE NEAREST EMERGENCY DEPARTMENT, IF ANY OF THE FOLLOWING OCCURS:   You are vomiting and cannot keep down fluids.   There is blood, pus or mucous in your stool.   You have symptoms of dehydration. These include dry mouth, not urinating (peeing) at least once every eight hours, feeling dizzy/lightheaded,  severe weakness or passing out.             ***    IF YOU DO NOT CONTINUE TO IMPROVE OR YOUR CONDITION WORSENS, PLEASE CONTACT YOUR DOCTOR OR RETURN IMMEDIATELY TO THE EMERGENCY DEPARTMENT.    Sincerely,  Aminzay, Kathrynn Speed, MD  Attending Emergency Physician  [facility] Emergency Department    ONSITE PHARMACY  Our full service onsite pharmacy is located in the ER waiting room.  Open 7 days a week from 9 am to 9 pm.  We accept all major insurances and prices are competitive with major retailers.  Ask your provider to print your prescriptions down to the pharmacy to speed you on your way home.    OBTAINING A PRIMARY CARE APPOINTMENT    Primary care physicians (PCPs, also known as primary care doctors) are either internists or family medicine doctors. Both types of PCPs focus on health promotion, disease prevention, patient education and counseling, and treatment of acute and chronic medical conditions.    Call for an appointment with a primary care doctor.  Ask to see who is taking new patients.     [facility]  telephone:  [phone]  [email or web address]    DOCTOR REFERRALS  Call [phone] (available 24 hours a day, 7 days a week) if you need any further referrals and we can help you find a primary care doctor or specialist.  Also, available online at:  [email or web address]    YOUR CONTACT INFORMATION  Before leaving please check with registration to make sure we have an up-to-date contact number.  You can call registration at [phone] to update your information.  For questions about your hospital bill, please call [phone].  For questions about your Emergency Dept Physician bill please call [phone].      FREE HEALTH SERVICES  If you need help with health or social services, please call 2-1-1 for a free referral to resources in your area.  2-1-1 is a free service connecting people with information on health insurance, free clinics, pregnancy, mental health,  dental care, food assistance, housing, and substance abuse counseling.  Also, available online at:  http://www.[place].org    MEDICAL RECORDS AND TESTS  Certain laboratory test results do not come back the same day, for example urine cultures.   We will contact you if other important findings are noted.  Radiology films are often reviewed again to ensure accuracy.  If there is any discrepancy, we will notify you.      Please call  [phone] to pick up a complimentary CD of any radiology studies performed.  If you or your doctor would like to request a copy of your medical records, please call [phone].      ORTHOPEDIC INJURY   Please know that significant injuries can exist even when an initial x-ray is read as normal or negative.  This can occur because some fractures (broken bones) are not initially visible on x-rays.  For this reason, close outpatient follow-up with your primary care doctor or bone specialist (orthopedist) is required.    MEDICATIONS AND FOLLOWUP  Please be aware that some prescription medications can cause drowsiness.  Use caution when driving or operating machinery.    The examination and treatment you have received in our Emergency Department is provided on an emergency basis, and is not intended to be a substitute for your primary care physician.  It is important that your doctor checks you again and that you report any new or remaining problems at that time.      24 HOUR PHARMACIES  The nearest 24 hour pharmacy is:    [facility]  [address]  [place], [place] [address]  [phone]      ASSISTANCE WITH INSURANCE    Affordable Care Act  [facility])  Call to start or finish an application, compare plans, enroll or ask a question.  [phone]  TTY: [phone]  Web:  Healthcare.gov    Help Enrolling in [facility]  [place]  405-530-5936 (TOLL-FREE)  803-680-4345 (TTY)  Web:  Http://www.coverva.org    Local Help Enrolling in the [facility]  [place]  Family Service  ([phone] (MAIN)  Email:  health-help@nvfs .org  Web:  BlackjackMyths.is  Address:  [address] [place], [place] [address]    SEDATING MEDICATIONS  Sedating medications include strong pain medications (e.g. narcotics), muscle relaxers, benzodiazepines (used for anxiety and as muscle relaxers), Benadryl/diphenhydramine and other antihistamines for allergic reactions/itching, and other medications.  If you are unsure if you have received a sedating medication, please ask your physician or nurse.  If you received a sedating medication: DO NOT drive a car. DO NOT operate machinery. DO NOT perform jobs where you need to be alert.  DO NOT drink alcoholic beverages while taking this medicine.     If you get dizzy, sit or lie down at the first signs. Be careful going up and down stairs.  Be extra careful to prevent falls.     Never give this medicine to others.     Keep this medicine out of reach of children.     Do not take or save old medicines. Throw them away when outdated.     Keep all medicines in a cool, dry place. DO NOT keep them in your bathroom medicine cabinet or in a cabinet above the stove.    MEDICATION REFILLS  Please be aware that we cannot refill any prescriptions through the ER. If you need further treatment from what is provided at your ER visit, please follow up with your primary care doctor or your pain management specialist.    FREESTANDING EMERGENCY DEPARTMENTS OF [facility]  Did you know [facility] has two freestanding ERs located just a few miles away?  [facility] [place] and [facility] [place] have short wait times, easy free parking directly in front of the building and top patient satisfaction scores - and the same Board Certified Emergency Medicine doctors as [facility].

## 2019-05-02 NOTE — ED Notes (Signed)
I have briefly evaluated this patient as triage physician to initiate ordering of diagnostic studies necessary to expedite care. I am not the primary provider for this patient. Please refer to the provider/reassessment/disposition notes for further information about this patients emergency department course.    Eric [name]  is a 28 y.o. male p/w LUQ pain. X 2 days. Unrelated to eating. No RLQ pain or tenderness.        Wandra Feinstein, MD  05/02/19 2002

## 2019-05-15 ENCOUNTER — Emergency Department
Admission: EM | Admit: 2019-05-15 | Discharge: 2019-05-15 | Disposition: A | Discharge status: Home / Self Care | Payer: Charity | Attending: Emergency Medicine | Admitting: Emergency Medicine

## 2019-05-15 ENCOUNTER — Emergency Department: Payer: Charity

## 2019-05-15 DIAGNOSIS — F17210 Nicotine dependence, cigarettes, uncomplicated: Secondary | ICD-10-CM | POA: Insufficient documentation

## 2019-05-15 DIAGNOSIS — R1013 Epigastric pain: Secondary | ICD-10-CM | POA: Insufficient documentation

## 2019-05-15 HISTORY — DX: Irritable bowel syndrome, unspecified: K58.9

## 2019-05-15 LAB — CBC AND DIFFERENTIAL
Absolute NRBC: 0 10*3/uL (ref 0.00–0.00)
Basophils Absolute Automated: 0.03 10*3/uL (ref 0.00–0.08)
Basophils Automated: 0.4 %
Eosinophils Absolute Automated: 0.08 10*3/uL (ref 0.00–0.44)
Eosinophils Automated: 1.1 %
Hematocrit: 46.8 % (ref 37.6–49.6)
Hgb: 14.1 g/dL (ref 12.5–17.1)
Immature Granulocytes Absolute: 0.02 10*3/uL (ref 0.00–0.07)
Immature Granulocytes: 0.3 %
Lymphocytes Absolute Automated: 1.81 10*3/uL (ref 0.42–3.22)
Lymphocytes Automated: 25.5 %
MCH: 22.7 pg — ABNORMAL LOW (ref 25.1–33.5)
MCHC: 30.1 g/dL — ABNORMAL LOW (ref 31.5–35.8)
MCV: 75.5 fL — ABNORMAL LOW (ref 78.0–96.0)
MPV: 11.8 fL (ref 8.9–12.5)
Monocytes Absolute Automated: 0.56 10*3/uL (ref 0.21–0.85)
Monocytes: 7.9 %
Neutrophils Absolute: 4.59 10*3/uL (ref 1.10–6.33)
Neutrophils: 64.8 %
Nucleated RBC: 0 /100 WBC (ref 0.0–0.0)
Platelets: 256 10*3/uL (ref 142–346)
RBC: 6.2 10*6/uL — ABNORMAL HIGH (ref 4.20–5.90)
RDW: 15 % (ref 11–15)
WBC: 7.09 10*3/uL (ref 3.10–9.50)

## 2019-05-15 LAB — COMPREHENSIVE METABOLIC PANEL
ALT: 17 U/L (ref 0–55)
AST (SGOT): 17 U/L (ref 5–34)
Albumin/Globulin Ratio: 1.2 (ref 0.9–2.2)
Albumin: 4.2 g/dL (ref 3.5–5.0)
Alkaline Phosphatase: 57 U/L (ref 38–106)
Anion Gap: 9 (ref 5.0–15.0)
BUN: 9 mg/dL (ref 9.0–28.0)
Bilirubin, Total: 1.7 mg/dL — ABNORMAL HIGH (ref 0.2–1.2)
CO2: 25 mEq/L (ref 22–29)
Calcium: 9.6 mg/dL (ref 8.5–10.5)
Chloride: 104 mEq/L (ref 100–111)
Creatinine: 1 mg/dL (ref 0.7–1.3)
Globulin: 3.6 g/dL (ref 2.0–3.6)
Glucose: 88 mg/dL (ref 70–100)
Potassium: 4 mEq/L (ref 3.5–5.1)
Protein, Total: 7.8 g/dL (ref 6.0–8.3)
Sodium: 138 mEq/L (ref 136–145)

## 2019-05-15 LAB — URINALYSIS REFLEX TO MICROSCOPIC EXAM - REFLEX TO CULTURE
Bilirubin, UA: NEGATIVE
Blood, UA: NEGATIVE
Glucose, UA: NEGATIVE
Ketones UA: NEGATIVE
Leukocyte Esterase, UA: NEGATIVE
Nitrite, UA: NEGATIVE
Protein, UR: NEGATIVE
Specific Gravity UA: 1.004 (ref 1.001–1.035)
Urine pH: 7 (ref 5.0–8.0)
Urobilinogen, UA: NORMAL mg/dL (ref 0.2–2.0)

## 2019-05-15 LAB — LIPASE: Lipase: 21 U/L (ref 8–78)

## 2019-05-15 LAB — GFR: EGFR: >60

## 2019-05-15 MED ORDER — LIDOCAINE VISCOUS HCL 2 % MT SOLN
10.00 mL | Freq: Once | OROMUCOSAL | Status: AC
Start: 2019-05-15 — End: 2019-05-15
  Administered 2019-05-15: 22:00:00 10 mL via OROMUCOSAL
  Filled 2019-05-15: qty 15

## 2019-05-15 MED ORDER — ALUM & MAG HYDROXIDE-SIMETH 200-200-20 MG/5ML PO SUSP
30.00 mL | Freq: Once | ORAL | Status: AC
Start: 2019-05-15 — End: 2019-05-15
  Administered 2019-05-15: 22:00:00 30 mL via ORAL
  Filled 2019-05-15: qty 30

## 2019-05-15 MED ORDER — IOHEXOL 350 MG/ML IV SOLN
100.00 mL | Freq: Once | INTRAVENOUS | Status: AC | PRN
Start: 2019-05-15 — End: 2019-05-15
  Administered 2019-05-15: 22:00:00 100 mL via INTRAVENOUS

## 2019-05-15 MED ORDER — FAMOTIDINE 20 MG PO TABS
20.00 mg | ORAL_TABLET | Freq: Two times a day (BID) | ORAL | 0 refills | Status: AC
Start: 2019-05-15 — End: 2019-05-20

## 2019-05-15 MED ORDER — ONDANSETRON HCL 4 MG/2ML IJ SOLN
4.00 mg | Freq: Once | INTRAMUSCULAR | Status: AC
Start: 2019-05-15 — End: 2019-05-15
  Administered 2019-05-15: 22:00:00 4 mg via INTRAVENOUS
  Filled 2019-05-15: qty 2

## 2019-05-15 MED ORDER — SODIUM CHLORIDE 0.9 % IV BOLUS
1000.00 mL | Freq: Once | INTRAVENOUS | Status: AC
Start: 2019-05-15 — End: 2019-05-15
  Administered 2019-05-15: 22:00:00 1000 mL via INTRAVENOUS

## 2019-05-15 MED ORDER — IOHEXOL 350 MG/ML IV SOLN
100.00 mL | Freq: Once | INTRAVENOUS | Status: DC | PRN
Start: 2019-05-15 — End: 2019-05-16

## 2019-05-15 MED ORDER — SUCRALFATE 1 G PO TABS
1.0000 g | ORAL_TABLET | Freq: Four times a day (QID) | ORAL | 0 refills | Status: DC
Start: 2019-05-15 — End: 2020-02-27

## 2019-05-15 NOTE — Discharge Instructions (Signed)
Dear [name]:    Thank you for choosing the [facility] Emergency Department, the premier emergency department in the [place] area.  I hope your visit today was EXCELLENT.    Specific instructions for your visit today:    Work work up today was normal. I suspect you have gastritis. You should take the medication as written and follow up with the stomach specialist listed.    IF YOU DO NOT CONTINUE TO IMPROVE OR YOUR CONDITION WORSENS, PLEASE CONTACT YOUR DOCTOR OR RETURN IMMEDIATELY TO THE EMERGENCY DEPARTMENT.    Sincerely,  Shresta Risden, Alben Spittle, MD  Attending Emergency Physician  [facility] Emergency Department    ONSITE PHARMACY  Our full service onsite pharmacy is located in the ER waiting room.  Open 7 days a week from 9 am to 9 pm.  We accept all major insurances and prices are competitive with major retailers.  Ask your provider to print your prescriptions down to the pharmacy to speed you on your way home.    OBTAINING A PRIMARY CARE APPOINTMENT    Primary care physicians (PCPs, also known as primary care doctors) are either internists or family medicine doctors. Both types of PCPs focus on health promotion, disease prevention, patient education and counseling, and treatment of acute and chronic medical conditions.    Call for an appointment with a primary care doctor.  Ask to see who is taking new patients.     [facility]  telephone:  [phone]  [email or web address]    DOCTOR REFERRALS  Call [phone] (available 24 hours a day, 7 days a week) if you need any further referrals and we can help you find a primary care doctor or specialist.  Also, available online at:  [email or web address]    YOUR CONTACT INFORMATION  Before leaving please check with registration to make sure we have an up-to-date contact number.  You can call registration at [phone] to update your information.  For questions about your hospital bill, please call [phone].  For  questions about your Emergency Dept Physician bill please call [phone].      FREE HEALTH SERVICES  If you need help with health or social services, please call 2-1-1 for a free referral to resources in your area.  2-1-1 is a free service connecting people with information on health insurance, free clinics, pregnancy, mental health, dental care, food assistance, housing, and substance abuse counseling.  Also, available online at:  http://www.[place].org    MEDICAL RECORDS AND TESTS  Certain laboratory test results do not come back the same day, for example urine cultures.   We will contact you if other important findings are noted.  Radiology films are often reviewed again to ensure accuracy.  If there is any discrepancy, we will notify you.      Please call [phone] to pick up a complimentary CD of any radiology studies performed.  If you or your doctor would like to request a copy of your medical records, please call [phone].      ORTHOPEDIC INJURY   Please know that significant injuries can exist even when an initial x-ray is read as normal or negative.  This can occur because some fractures (broken bones) are not initially visible on x-rays.  For this reason, close outpatient follow-up with your primary care doctor or bone specialist (orthopedist) is required.    MEDICATIONS AND FOLLOWUP  Please be aware that some prescription medications can cause drowsiness.  Use caution when driving or operating  machinery.    The examination and treatment you have received in our Emergency Department is provided on an emergency basis, and is not intended to be a substitute for your primary care physician.  It is important that your doctor checks you again and that you report any new or remaining problems at that time.      [place]  The nearest 24 hour pharmacy is:    [facility] [place] [place] [address]  [place]  [facility])  Call to start or finish an application, compare plans, enroll or ask a question.  [place]: [phone]  Web:  Healthcare.gov    Help Enrolling in [place]  820-557-6763 (TOLL-FREE)  [phone] (TTY)  Web:  Http://www.coverva.org    Local Help Enrolling in the [place]  [phone] (MAIN)  Email:  health-help_0 .org  Web:  http://lewis-perez.info/  Address:  [address] [place], [place] [address]    SEDATING MEDICATIONS  Sedating medications include strong pain medications (e.g. narcotics), muscle relaxers, benzodiazepines (used for anxiety and as muscle relaxers), Benadryl/diphenhydramine and other antihistamines for allergic reactions/itching, and other medications.  If you are unsure if you have received a sedating medication, please ask your physician or nurse.  If you received a sedating medication: DO NOT drive a car. DO NOT operate machinery. DO NOT perform jobs where you need to be alert.  DO NOT drink alcoholic beverages while taking this medicine.     If you get dizzy, sit or lie down at the first signs. Be careful going up and down stairs.  Be extra careful to prevent falls.     Never give this medicine to others.     Keep this medicine out of reach of children.     Do not take or save old medicines. Throw them away when outdated.     Keep all medicines in a cool, dry place. DO NOT keep them in your bathroom medicine cabinet or in a cabinet above the stove.    MEDICATION REFILLS  Please be aware that we cannot refill any prescriptions through the ER. If you need further treatment from what is provided at your ER visit, please follow up with your primary care doctor or your pain management specialist.    [place]  Did you know [facility] has two freestanding ERs located just a few miles away?  [facility] [place] [facility] [place] have short wait  times, easy free parking directly in front of the building and top patient satisfaction scores - and the same Board Certified Emergency Medicine doctors as [facility].

## 2019-05-15 NOTE — ED Provider Notes (Signed)
[facility] EMERGENCY DEPARTMENT H&P      Visit date: 05/15/2019      CLINICAL SUMMARY          Diagnosis:    .     Final diagnoses:   Epigastric pain         MDM Notes:      Upper abd pain  Work up non diagnostic  Only had dark stool after pepto  Most c/w gastritis/ PUD  Resolved with GI cocktail    Pain well controlled.   All results reviewed with patient.  Given Return to ED Precautions.  Comfortable with plan for follow up as out patient.        Disposition:         Discharge           I was present for the bedside discharge alongside the patient's ED nurse. Course of visit in the ED and discharge instructions were reviewed with patient and they were given the opportunity to ask any questions regarding their care today. Patient and/ or patient's family verbalized understanding of, and comfort with, instructions and plan.          Discharge Prescriptions     Medication Sig Dispense Auth. Provider    sucralfate (CARAFATE) 1 g tablet Take 1 tablet (1 g total) by mouth 4 (four) times daily 20 tablet Valorie Mcgrory, Alben Spittle, MD    famotidine (PEPCID) 20 MG tablet Take 1 tablet (20 mg total) by mouth 2 (two) times daily for 5 days 10 tablet Reighan Hipolito, Alben Spittle, MD                        CLINICAL INFORMATION        HPI:      Chief Complaint: Abdominal Pain  .    Eric [name] is a 28 y.o. male who presents with upper abd pain, sperads throughout  Taking peptobismol and gas x for sx. No vomiting, but feels nauseated. Had one dark BM, but not tarry, was after pepto  No change in what he is eating  Does not seem to be affected by eating  No fevers or sick contacts     History obtained from: Patient          ROS:      Positive and negative ROS elements as per HPI.  All other systems reviewed and negative.      Physical Exam:      Pulse 89   BP 130/79   Resp 20   SpO2 94 %   Temp 98.1 F (36.7 C)    Physical Exam  Vitals signs and nursing note reviewed.   Constitutional:       General: He is not in acute distress.     Appearance: He  is well-developed.   HENT:      Head: Normocephalic and atraumatic.   Eyes:      General: No scleral icterus.     Conjunctiva/sclera: Conjunctivae normal.      Pupils: Pupils are equal, round, and reactive to light.   Neck:      Musculoskeletal: Normal range of motion and neck supple.   Cardiovascular:      Rate and Rhythm: Normal rate and regular rhythm.      Heart sounds: Normal heart sounds. No murmur. No friction rub. No gallop.    Pulmonary:      Effort: Pulmonary effort is normal. No respiratory distress.  Breath sounds: Normal breath sounds. No wheezing or rales.   Abdominal:      General: Bowel sounds are normal. There is no distension.      Palpations: Abdomen is soft.      Tenderness: There is abdominal tenderness (midl epigastric). There is no guarding or rebound.   Musculoskeletal: Normal range of motion.         General: No deformity.   Skin:     General: Skin is warm and dry.      Findings: No rash.   Neurological:      Mental Status: He is alert and oriented to person, place, and time.      Cranial Nerves: No cranial nerve deficit.                    PAST HISTORY        Primary Care Provider: Pcp, None, MD        PMH/PSH:    .     Past Medical History:   Diagnosis Date    Colon abnormality     IBS (irritable bowel syndrome)        He has no past surgical history on file.      Social/Family History:      He reports that he has been smoking. He has been smoking about 0.50 packs per day. He has never used smokeless tobacco. He reports current alcohol use. He reports that he does not use drugs.    History reviewed. No pertinent family history.      Listed Medications on Arrival:    .     Home Medications     Med List Status:  In Progress Set By: Glean Salvo, RN at 05/15/2019  8:53 PM        No Medications         Allergies: He has No Known Allergies.            VISIT INFORMATION        Clinical Course in the ED:            Medications Given in the ED:    .     ED Medication Orders (From  admission, onward)    Start Ordered     Status Ordering Provider    05/15/19 2237 05/15/19 2237    IMG once as needed     Route: Intravenous  Ordered Dose: 100 mL     Discontinued Krizia Flight E    05/15/19 2225 05/15/19 2225  iohexol (OMNIPAQUE) 350 MG/ML injection 100 mL  IMG once as needed     Route: Intravenous  Ordered Dose: 100 mL     Last MAR action:  Imaging Agent Given Melany Guernsey E    05/15/19 2143 05/15/19 2143  sodium chloride 0.9 % bolus 1,000 mL  Once     Route: Intravenous  Ordered Dose: 1,000 mL     Last MAR action:  Stopped Zalman Hull E    05/15/19 2143 05/15/19 2143  ondansetron (ZOFRAN) injection 4 mg  Once     Route: Intravenous  Ordered Dose: 4 mg     Last MAR action:  Given Allizon Woznick E    05/15/19 2143 05/15/19 2143  lidocaine viscous (XYLOCAINE) 2 % solution 10 mL  Once     Route: Mouth/Throat  Ordered Dose: 10 mL     Last MAR action:  Given Shahir Karen E    05/15/19 2143 05/15/19 2143  alum &  mag hydroxide-simethicone (MAALOX PLUS) 200-200-20 mg/5 mL suspension 30 mL  Once     Route: Oral  Ordered Dose: 30 mL     Last MAR action:  Given Kamil Hanigan E            Procedures:      Procedures      Interpretations:      Monitor -         interpreted by me: sinus 80s  O2 sat-                   saturation: 100 %; Oxygen use: room air; Interpretation: Normal                  RESULTS        Lab Results:      Results     Procedure Component Value Units Date/Time    UA Reflex to Micro - Reflex to Culture [[id number]] Collected:  05/15/19 2204    Specimen:  Urine, Clean Catch Updated:  05/15/19 2222     Urine Type Urine, Clean Ca     Color, UA Colorless     Clarity, UA Clear     Specific Gravity UA 1.004     Urine pH 7.0     Leukocyte Esterase, UA Negative     Nitrite, UA Negative     Protein, UR Negative     Glucose, UA Negative     Ketones UA Negative     Urobilinogen, UA Normal mg/dL      Bilirubin, UA Negative     Blood, UA Negative    Narrative:       Replace urinary catheter prior to  obtaining the urine culture  if it has been in place for greater than or equal to 14  days:->N/A No Foley  Indications for U/A Reflex to Micro - Reflex to  Culture:->Suprapubic Pain/Tenderness or Dysuria    Comprehensive metabolic panel [[id number]]  (Abnormal) Collected:  05/15/19 2030    Specimen:  Blood Updated:  05/15/19 2122     Glucose 88 mg/dL      BUN 9.0 mg/dL      Creatinine 1.0 mg/dL      Sodium 621 mEq/L      Potassium 4.0 mEq/L      Chloride 104 mEq/L      CO2 25 mEq/L      Calcium 9.6 mg/dL      Protein, Total 7.8 g/dL      Albumin 4.2 g/dL      AST (SGOT) 17 U/L      ALT 17 U/L      Alkaline Phosphatase 57 U/L      Bilirubin, Total 1.7 mg/dL      Globulin 3.6 g/dL      Albumin/Globulin Ratio 1.2     Anion Gap 9.0    Narrative:       Replace urinary catheter prior to obtaining the urine culture  if it has been in place for greater than or equal to 14  days:->N/A No Foley  Indications for U/A Reflex to Micro - Reflex to  Culture:->Suprapubic Pain/Tenderness or Dysuria    Lipase [[id number]] Collected:  05/15/19 2030    Specimen:  Blood Updated:  05/15/19 2122     Lipase 21 U/L     Narrative:       Replace urinary catheter prior to obtaining the urine culture  if it has been in place for greater than or equal  to 14  days:->N/A No Foley  Indications for U/A Reflex to Micro - Reflex to  Culture:->Suprapubic Pain/Tenderness or Dysuria    GFR [[id number]] Collected:  05/15/19 2030     Updated:  05/15/19 2122     EGFR >60.0    Narrative:       Replace urinary catheter prior to obtaining the urine culture  if it has been in place for greater than or equal to 14  days:->N/A No Foley  Indications for U/A Reflex to Micro - Reflex to  Culture:->Suprapubic Pain/Tenderness or Dysuria    CBC and differential [[id number]]  (Abnormal) Collected:  05/15/19 2030    Specimen:  Blood Updated:  05/15/19 2115     WBC 7.09 x10 3/uL      Hgb 14.1 g/dL      Hematocrit 57.3 %      Platelets 256 x10 3/uL      RBC 6.20 x10 6/uL       MCV 75.5 fL      MCH 22.7 pg      MCHC 30.1 g/dL      RDW 15 %      MPV 11.8 fL      Neutrophils 64.8 %      Lymphocytes Automated 25.5 %      Monocytes 7.9 %      Eosinophils Automated 1.1 %      Basophils Automated 0.4 %      Immature Granulocytes 0.3 %      Nucleated RBC 0.0 /100 WBC      Neutrophils Absolute 4.59 x10 3/uL      Lymphocytes Absolute Automated 1.81 x10 3/uL      Monocytes Absolute Automated 0.56 x10 3/uL      Eosinophils Absolute Automated 0.08 x10 3/uL      Basophils Absolute Automated 0.03 x10 3/uL      Immature Granulocytes Absolute 0.02 x10 3/uL      Absolute NRBC 0.00 x10 3/uL     Narrative:       Replace urinary catheter prior to obtaining the urine culture  if it has been in place for greater than or equal to 14  days:->N/A No Foley  Indications for U/A Reflex to Micro - Reflex to  Culture:->Suprapubic Pain/Tenderness or Dysuria              Radiology Results:      CT Abd/Pelvis with IV Contrast only   Final Result      1. Wall thickening of the gastric antrum and first/second portion of the   duodenum can be seen in setting of gastroduodenitis.   2. Otherwise, no acute inflammatory process within the abdomen or   pelvis.      Launa Flight, MD    05/15/2019 10:43 PM                  Scribe Attestation:      No scribe involved in the care of this patient          Randolph Bing, MD  05/16/19 [phone]

## 2019-05-15 NOTE — ED Triage Notes (Signed)
Dr. Wilma Flavin, acting as triage physician, entered orders to expedite patient care.    Pt was seen 6/22 for abdominal pain  He had normal labs and no imaging completed  He notes continued pain, burning  Notes black stool  Denies n/v/d, f/c

## 2020-02-27 ENCOUNTER — Emergency Department: Payer: 344

## 2020-02-27 DIAGNOSIS — Z20822 Contact with and (suspected) exposure to covid-19: Secondary | ICD-10-CM | POA: Insufficient documentation

## 2020-02-27 DIAGNOSIS — R1011 Right upper quadrant pain: Secondary | ICD-10-CM | POA: Insufficient documentation

## 2020-02-27 LAB — COMPREHENSIVE METABOLIC PANEL
ALT: 15 U/L (ref 0–55)
AST (SGOT): 14 U/L (ref 5–34)
Albumin/Globulin Ratio: 1.2 (ref 0.9–2.2)
Albumin: 4 g/dL (ref 3.5–5.0)
Alkaline Phosphatase: 58 U/L (ref 38–106)
Anion Gap: 9 (ref 5.0–15.0)
BUN: 15 mg/dL (ref 9.0–28.0)
Bilirubin, Total: 0.7 mg/dL (ref 0.2–1.2)
CO2: 25 mEq/L (ref 22–29)
Calcium: 9.4 mg/dL (ref 8.5–10.5)
Chloride: 107 mEq/L (ref 100–111)
Creatinine: 1 mg/dL (ref 0.7–1.3)
Globulin: 3.3 g/dL (ref 2.0–3.6)
Glucose: 63 mg/dL — ABNORMAL LOW (ref 70–100)
Potassium: 3.8 mEq/L (ref 3.5–5.1)
Protein, Total: 7.3 g/dL (ref 6.0–8.3)
Sodium: 141 mEq/L (ref 136–145)

## 2020-02-27 LAB — CBC AND DIFFERENTIAL
Absolute NRBC: 0 10*3/uL (ref 0.00–0.00)
Basophils Absolute Automated: 0.02 10*3/uL (ref 0.00–0.08)
Basophils Automated: 0.3 %
Eosinophils Absolute Automated: 0.05 10*3/uL (ref 0.00–0.44)
Eosinophils Automated: 0.8 %
Hematocrit: 42.1 % (ref 37.6–49.6)
Hgb: 13.2 g/dL (ref 12.5–17.1)
Immature Granulocytes Absolute: 0.01 10*3/uL (ref 0.00–0.07)
Immature Granulocytes: 0.2 %
Lymphocytes Absolute Automated: 1.87 10*3/uL (ref 0.42–3.22)
Lymphocytes Automated: 28.5 %
MCH: 23.3 pg — ABNORMAL LOW (ref 25.1–33.5)
MCHC: 31.4 g/dL — ABNORMAL LOW (ref 31.5–35.8)
MCV: 74.3 fL — ABNORMAL LOW (ref 78.0–96.0)
MPV: 11 fL (ref 8.9–12.5)
Monocytes Absolute Automated: 0.59 10*3/uL (ref 0.21–0.85)
Monocytes: 9 %
Neutrophils Absolute: 4.03 10*3/uL (ref 1.10–6.33)
Neutrophils: 61.2 %
Nucleated RBC: 0 /100 WBC (ref 0.0–0.0)
Platelets: 279 10*3/uL (ref 142–346)
RBC: 5.67 10*6/uL (ref 4.20–5.90)
RDW: 15 % (ref 11–15)
WBC: 6.57 10*3/uL (ref 3.10–9.50)

## 2020-02-27 LAB — COVID-19 (SARS-COV-2) & INFLUENZA  A/B, NAA (ROCHE LIAT)
Influenza A: NOT DETECTED
Influenza B: NOT DETECTED
SARS CoV 2 Overall Result: NOT DETECTED

## 2020-02-27 LAB — LIPASE: Lipase: 17 U/L (ref 8–78)

## 2020-02-27 LAB — GFR: EGFR: >60

## 2020-02-27 LAB — HEMOLYSIS INDEX: Hemolysis Index: 6 (ref 0–18)

## 2020-02-27 NOTE — ED Triage Notes (Signed)
EMERGENCY DEPARTMENT PIT NOTE    Patient Name: Eric [name]    Eric [name] has had a rapid medical screening evaluation initiated by myself for the chief complaint of epigastric/RUQ abd pain x 2 days w/ 2 episodes of melena, one just PTA. PMHx IBS. Lives in group home. Prefers Arabic.      Vitals: BP 112/78    Pulse 94    Temp 98.4 F (36.9 C) (Oral)    Resp 18    Wt 62.6 kg    SpO2 100%    BMI 18.70 kg/m   Pertinent brief exam: non-toxic appearing, RUQ/epigastric mild-moderate ttp, rrr, lungs cta  Prelim orders: labs, covid, RUQ [place]    Patient advised to remain in the ED until further evaluation can be performed. Patient instructed to notify staff of any changes in condition while waiting.    I am not the sole provider and this assessment is only an initial evaluation prior to full evaluation to expedite care.

## 2020-02-28 ENCOUNTER — Emergency Department
Admission: EM | Admit: 2020-02-28 | Discharge: 2020-02-28 | Disposition: A | Discharge status: Home / Self Care | Payer: 344 | Attending: Emergency Medicine | Admitting: Emergency Medicine

## 2020-02-28 DIAGNOSIS — R1010 Upper abdominal pain, unspecified: Secondary | ICD-10-CM

## 2020-02-28 MED ORDER — SUCRALFATE 1 G PO TABS
1.0000 g | ORAL_TABLET | Freq: Four times a day (QID) | ORAL | 0 refills | Status: DC
Start: 2020-02-28 — End: 2020-07-29

## 2020-02-28 MED ORDER — FAMOTIDINE 10 MG/ML IV SOLN (WRAP)
20.00 mg | Freq: Once | INTRAVENOUS | Status: AC
Start: 2020-02-28 — End: 2020-02-28
  Administered 2020-02-28: 02:00:00 20 mg via INTRAVENOUS
  Filled 2020-02-28: qty 2

## 2020-02-28 MED ORDER — ALUM & MAG HYDROXIDE-SIMETH 200-200-20 MG/5ML PO SUSP
30.00 mL | Freq: Once | ORAL | Status: AC
Start: 2020-02-28 — End: 2020-02-28
  Administered 2020-02-28: 02:00:00 30 mL via ORAL
  Filled 2020-02-28: qty 30

## 2020-02-28 MED ORDER — FAMOTIDINE 20 MG PO TABS
20.00 mg | ORAL_TABLET | Freq: Two times a day (BID) | ORAL | 0 refills | Status: AC
Start: 2020-02-28 — End: 2020-03-06

## 2020-02-28 MED ORDER — LIDOCAINE VISCOUS HCL 2 % MT SOLN
10.00 mL | Freq: Once | OROMUCOSAL | Status: AC
Start: 2020-02-28 — End: 2020-02-28
  Administered 2020-02-28: 02:00:00 10 mL via OROMUCOSAL
  Filled 2020-02-28: qty 15

## 2020-02-28 NOTE — ED Provider Notes (Signed)
Pt presents with epigastric pain going up to chest.  Pt states that it started in the evening while he was fasting.  Pt took a gavascon which helped with the pain a bit.  No vomiting.  No diarrhea.  Pt states that he was fasting during the day.  Pt not on any medications regularly.  Pt is traveling back to [place] in 1 week.  Does not have a doctor here.  Hx from the pt and video interpreter.      Reviewed Past Medical History, Surgical History, Family History and Social as documented.      Review of Systems   Constitutional: Negative.    Gastrointestinal: Positive for abdominal pain. Negative for vomiting.   Genitourinary: Negative.    All other systems reviewed and are negative.        Physical Exam  Vitals and nursing note reviewed.   Constitutional:       Appearance: Normal appearance.   HENT:      Head: Normocephalic and atraumatic.   Eyes:      General: No scleral icterus.     Extraocular Movements: Extraocular movements intact.   Cardiovascular:      Rate and Rhythm: Normal rate and regular rhythm.   Pulmonary:      Effort: Pulmonary effort is normal.      Breath sounds: Normal breath sounds.   Abdominal:      General: There is no distension.      Tenderness: There is no abdominal tenderness.   Musculoskeletal:         General: Normal range of motion.      Cervical back: Normal range of motion and neck supple.      Right lower leg: No edema.   Skin:     General: Skin is warm and dry.   Neurological:      General: No focal deficit present.      Mental Status: He is alert and oriented to person, place, and time.     Oxygen saturations are 100%    2:44 AM  Discussed results and tx plan.    Likely gastritis.      Impression:  1. Upper abdominal pain             Jethro Bastos, MD  02/28/20 [phone]

## 2020-02-28 NOTE — Discharge Instructions (Signed)
Abdominal Pain     You have been diagnosed with abdominal (belly) pain. The cause of your pain is not yet known.     Many things can cause abdominal pain such as infections and bowel (intestine) spasms. You might need another examination or more tests to find out why you have pain.     At this time, your pain does not seem to be caused by anything dangerous. You do not need surgery. You do not need to stay in the hospital.      Though we don’t believe your condition is dangerous right now, it is important to be careful. Sometimes a problem that seems mild now can become serious later. If you do not get completely better or your symptoms get worse, you should seek more care. This is why it is important that you get additional help unless you are 100% improved  · Follow up with your doctor.     For the next 24 hours, Drink only clear liquids such as:  · Water.  · Clear broth.  · Sports drinks.  · Clear caffeine-free soft drinks, like 7-Up or Sprite.     Return here or go to the nearest Emergency Department immediately if:  · Your pain does not go away or gets worse.  · You cannot keep fluids down   · Your vomit (throw up) is dark green.   · You vomit (throw up) blood or see blood in your stool (poop). Blood might be bright red or dark red. It can also be black and look like tar.  · You have a fever (temperature higher than 100.4ºF / 38ºC) or shaking chills.  · Your skin or eyes look yellow.  · Your urine looks brown.  · You have severe diarrhea.     If you can't follow up with your doctor, or if at any time you feel you need to be rechecked or seen again, come back here or go to the nearest emergency department.

## 2020-07-29 ENCOUNTER — Emergency Department
Admission: EM | Admit: 2020-07-29 | Discharge: 2020-07-29 | Disposition: A | Discharge status: Home / Self Care | Payer: Self-pay | Attending: Emergency Medicine | Admitting: Emergency Medicine

## 2020-07-29 DIAGNOSIS — K219 Gastro-esophageal reflux disease without esophagitis: Secondary | ICD-10-CM | POA: Insufficient documentation

## 2020-07-29 DIAGNOSIS — F17210 Nicotine dependence, cigarettes, uncomplicated: Secondary | ICD-10-CM | POA: Insufficient documentation

## 2020-07-29 LAB — ECG 12-LEAD
Atrial Rate: 67 {beats}/min
P Axis: 75 degrees
P-R Interval: 236 ms
Q-T Interval: 348 ms
QRS Duration: 76 ms
QTC Calculation (Bezet): 367 ms
R Axis: 87 degrees
T Axis: 67 degrees
Ventricular Rate: 67 {beats}/min

## 2020-07-29 MED ORDER — FAMOTIDINE 20 MG PO TABS
20.00 mg | ORAL_TABLET | Freq: Two times a day (BID) | ORAL | 0 refills | Status: AC
Start: 2020-07-29 — End: 2020-08-12

## 2020-07-29 MED ORDER — SUCRALFATE 1 G PO TABS
1.00 g | ORAL_TABLET | Freq: Three times a day (TID) | ORAL | 0 refills | Status: AC
Start: 2020-07-29 — End: 2020-08-01

## 2020-07-29 MED ORDER — LIDOCAINE VISCOUS HCL 2 % MT SOLN
10.00 mL | Freq: Once | OROMUCOSAL | Status: AC
Start: 2020-07-29 — End: 2020-07-29
  Administered 2020-07-29: 05:00:00 10 mL via OROMUCOSAL
  Filled 2020-07-29: qty 15

## 2020-07-29 MED ORDER — ALUM & MAG HYDROXIDE-SIMETH 200-200-20 MG/5ML PO SUSP
30.00 mL | Freq: Once | ORAL | Status: AC
Start: 2020-07-29 — End: 2020-07-29
  Administered 2020-07-29: 05:00:00 30 mL via ORAL
  Filled 2020-07-29: qty 30

## 2020-07-29 MED ORDER — FAMOTIDINE 20 MG PO TABS
20.0000 mg | ORAL_TABLET | Freq: Once | ORAL | Status: DC
Start: 2020-07-29 — End: 2020-07-29

## 2020-07-29 NOTE — ED Triage Notes (Signed)
Eric [name] is a 29 y.o. male presenting to the ED for acid reflux that began yesterday in the morning, patient reports he was drinking alcohol the night before. Patient reports slight abdominal pain, and lack of appetite. Denies vomiting.

## 2020-07-29 NOTE — Discharge Instructions (Signed)
Gastroesophageal Reflux Disease, GERD     You have been diagnosed with gastroesophageal reflux disease. This is sometimes called GERD.     With this condition, acid from the stomach backs up into the esophagus (food pipe). Most people have this as occasional heartburn. Sometimes, the backup happens so often that the symptoms are severe (serious). Over time, damage may be done to the esophagus.     Medicines may be prescribed to lower acid secretion in the stomach or coat the esophagus. Take all medicines as directed.     There is no evidence that spicy foods cause reflux. Some people find that avoiding caffeine, alcohol, and spicy and acidic foods lowers discomfort. Just pay attention to what you eat. If a food makes symptoms worse, don't eat it.     Don't lie down after eating.     Don't smoke. Smoking may relax the esophagus muscles that keep acid in the stomach.     Obesity (being overweight) may make reflux symptoms worse. Talk to your doctor about a weight loss plan.     Do not eat less than 2-3 hours before bedtime.     You can buy antacids over the counter (without a prescription). Use them as directed to relieve symptoms.     Follow up as directed. You may be referred to a gastroenterologist (digestive system doctor) for further evaluation.     YOU SHOULD SEEK MEDICAL ATTENTION IMMEDIATELY, EITHER HERE OR AT THE NEAREST EMERGENCY DEPARTMENT, IF ANY OF THE FOLLOWING OCCURS:  · Your pain increases or changes.  · New or different chest pain.  · You are vomiting (throwing up) constantly or vomiting blood or material that looks like "coffee grounds."  · Your stool has blood, gets very dark or looks like tar.

## 2020-07-29 NOTE — ED Provider Notes (Signed)
EMERGENCY DEPARTMENT HISTORY AND PHYSICAL EXAM     None        Date: 07/29/2020  Patient Name: Eric [name]    History of Presenting Illness     Chief Complaint   Patient presents with    Heartburn       History Provided By: Patient    Chief Complaint: heartburn  Duration: since sat morning  Timing:  Intermittent  Location: epigastric    Additional History: Eric [name] is a 29 y.o. male presenting to the ED with epigastric abd pain on sat morning after drinking alcohol Friday night. Only drinks alcohol on weekends and it usually triggers this for him. Had the same in April when he came to the ED. Denies fevers, cough, cp, sob, n/v/d, black stools. Tried [facility] heartburn medicine without relief      PCP: Pcp, None, MD  SPECIALISTS:    Current Facility-Administered Medications   Medication Dose Route Frequency Provider Last Rate Last Admin    famotidine (PEPCID) tablet 20 mg  20 mg Oral Once Juliane Poot, MD PhD         Current Outpatient Medications   Medication Sig Dispense Refill    famotidine (PEPCID) 20 MG tablet Take 1 tablet (20 mg total) by mouth 2 (two) times daily for 14 days 28 tablet 0    sucralfate (CARAFATE) 1 g tablet Take 1 tablet (1 g total) by mouth 3 (three) times daily before meals for 3 days 9 tablet 0       Past History     Past Medical History:  Past Medical History:   Diagnosis Date    Colon abnormality     IBS (irritable bowel syndrome)        Past Surgical History:  History reviewed. No pertinent surgical history.    Family History:  History reviewed. No pertinent family history.    Social History:  Social History     Tobacco Use    Smoking status: Current Every Day Smoker     Packs/day: 0.50     Types: Cigarettes    Smokeless tobacco: Never Used   Vaping Use    Vaping Use: Never used   Substance Use Topics    Alcohol use: Not Currently     Comment: socially on weekends per pt    Drug use: Never       Allergies:  No Known Allergies    Review of Systems      Review of Systems   Constitutional: Negative for fever.   Respiratory: Negative for cough and shortness of breath.    Cardiovascular: Negative for chest pain.   Gastrointestinal: Positive for abdominal pain. Negative for blood in stool, diarrhea, nausea and vomiting.   All other systems reviewed and are negative.      Physical Exam   BP 113/82    Pulse 77    Temp 98 F (36.7 C) (Oral)    Resp 18    Ht 5\' 10"  (1.778 m)    Wt 63.5 kg    SpO2 98%    BMI 20.09 kg/m     Physical Exam   Constitutional: Patient is oriented to person, place, and time and well-developed, well-nourished, and in no distress.   Head: Normocephalic and atraumatic.   Eyes: EOM are normal. Pupils are equal, round, and reactive to light. pink subconj. No scleral icterus  ENT: OP clear, MMM  Neck: Normal range of motion. Neck supple.  Cardiovascular: Normal rate and regular rhythm. No murmurs or rubs.  Pulmonary/Chest: Effort normal and breath sounds normal. No respiratory distress.   Abdominal: Soft. Not distended.There is no tenderness. No rebound or guarding.  Musculoskeletal: Normal range of motion. No lower extremity edema.  Neurological: Patient is alert and oriented to person, place, and time. GCS score is 15.   Skin: Skin is warm and dry.       Diagnostic Study Results     Labs -     Results     ** No results found for the last 24 hours. **          Radiologic Studies -   Radiology Results (24 Hour)     ** No results found for the last 24 hours. **      .    Medical Decision Making   I am the first provider for this patient.    I reviewed the vital signs, available nursing notes, past medical history, past surgical history, family history and social history.    Vital Signs-Reviewed the patient's vital signs.     Patient Vitals for the past 12 hrs:   BP Temp Pulse Resp   07/29/20 0516 113/82 98 F (36.7 C) 77 18       Pulse Oximetry Analysis - Normal 98% on RA      Old Medical Records: Old medical records. Seen for epigastric abd  pain 02/28/2020 and 05/15/2019. [place] neg for gallstones, lipase, cbc normal, lft's normal    ED Course:     The patient verbalizes understanding of the tx plan, need for immediate follow up, and possible medication side effects. The patient agrees with discharge home at this time.    Provider Notes (Initial Assessment): likely gerd, hx of same. No si/sx UGIB. Afebrile, nontoxic, not peritoneal. Had same sx in April and neg workup for cholecystitis, pancreatitisProv            Diagnosis     Clinical Impression:   1. Gastroesophageal reflux disease, unspecified whether esophagitis present        Treatment Plan:   ED Disposition     ED Disposition Condition Date/Time Comment    Discharge  Sun Jul 29, 2020  9:23 AM Saifullah Elenor Legato I [place] discharge to home/self care.    Condition at disposition: Stable            _______________________________  Attestations:     None        I am the first provider for this patient.    Marquette Old, MD is the primary emergency doctor of record.    I reviewed the vital signs, available nursing notes, past medical history, past surgical history, family history and social history.       Marquette Old, MD  07/29/20 [phone]

## 2020-09-05 ENCOUNTER — Emergency Department: Payer: 344

## 2020-09-05 ENCOUNTER — Emergency Department
Admission: EM | Admit: 2020-09-05 | Discharge: 2020-09-05 | Disposition: A | Discharge status: Home / Self Care | Payer: 344 | Attending: Emergency Medicine | Admitting: Emergency Medicine

## 2020-09-05 DIAGNOSIS — K2970 Gastritis, unspecified, without bleeding: Secondary | ICD-10-CM | POA: Insufficient documentation

## 2020-09-05 DIAGNOSIS — Z20822 Contact with and (suspected) exposure to covid-19: Secondary | ICD-10-CM | POA: Insufficient documentation

## 2020-09-05 DIAGNOSIS — R079 Chest pain, unspecified: Secondary | ICD-10-CM

## 2020-09-05 DIAGNOSIS — R6883 Chills (without fever): Secondary | ICD-10-CM

## 2020-09-05 DIAGNOSIS — F17210 Nicotine dependence, cigarettes, uncomplicated: Secondary | ICD-10-CM | POA: Insufficient documentation

## 2020-09-05 DIAGNOSIS — J029 Acute pharyngitis, unspecified: Secondary | ICD-10-CM | POA: Insufficient documentation

## 2020-09-05 DIAGNOSIS — Z8719 Personal history of other diseases of the digestive system: Secondary | ICD-10-CM

## 2020-09-05 DIAGNOSIS — K589 Irritable bowel syndrome without diarrhea: Secondary | ICD-10-CM | POA: Insufficient documentation

## 2020-09-05 DIAGNOSIS — R1013 Epigastric pain: Secondary | ICD-10-CM

## 2020-09-05 LAB — CBC AND DIFFERENTIAL
Absolute NRBC: 0 10*3/uL (ref 0.00–0.00)
Basophils Absolute Automated: 0.02 10*3/uL (ref 0.00–0.08)
Basophils Automated: 0.2 %
Eosinophils Absolute Automated: 0.02 10*3/uL (ref 0.00–0.44)
Eosinophils Automated: 0.2 %
Hematocrit: 42.9 % (ref 37.6–49.6)
Hgb: 13.4 g/dL (ref 12.5–17.1)
Immature Granulocytes Absolute: 0.02 10*3/uL (ref 0.00–0.07)
Immature Granulocytes: 0.2 %
Lymphocytes Absolute Automated: 1.15 10*3/uL (ref 0.42–3.22)
Lymphocytes Automated: 11.8 %
MCH: 22.4 pg — ABNORMAL LOW (ref 25.1–33.5)
MCHC: 31.2 g/dL — ABNORMAL LOW (ref 31.5–35.8)
MCV: 71.7 fL — ABNORMAL LOW (ref 78.0–96.0)
MPV: 11.9 fL (ref 8.9–12.5)
Monocytes Absolute Automated: 0.96 10*3/uL — ABNORMAL HIGH (ref 0.21–0.85)
Monocytes: 9.8 %
Neutrophils Absolute: 7.58 10*3/uL — ABNORMAL HIGH (ref 1.10–6.33)
Neutrophils: 77.8 %
Nucleated RBC: 0 /100 WBC (ref 0.0–0.0)
Platelets: 248 10*3/uL (ref 142–346)
RBC: 5.98 10*6/uL — ABNORMAL HIGH (ref 4.20–5.90)
RDW: 16 % — ABNORMAL HIGH (ref 11–15)
WBC: 9.75 10*3/uL — ABNORMAL HIGH (ref 3.10–9.50)

## 2020-09-05 LAB — COMPREHENSIVE METABOLIC PANEL
ALT: 10 U/L (ref 0–55)
AST (SGOT): 13 U/L (ref 5–34)
Albumin/Globulin Ratio: 1.1 (ref 0.9–2.2)
Albumin: 3.9 g/dL (ref 3.5–5.0)
Alkaline Phosphatase: 64 U/L (ref 38–106)
Anion Gap: 8 (ref 5.0–15.0)
BUN: 8 mg/dL — ABNORMAL LOW (ref 9.0–28.0)
Bilirubin, Total: 1.1 mg/dL (ref 0.2–1.2)
CO2: 24 mEq/L (ref 22–29)
Calcium: 9.3 mg/dL (ref 8.5–10.5)
Chloride: 106 mEq/L (ref 100–111)
Creatinine: 1 mg/dL (ref 0.7–1.3)
Globulin: 3.6 g/dL (ref 2.0–3.6)
Glucose: 97 mg/dL (ref 70–100)
Potassium: 3.4 mEq/L — ABNORMAL LOW (ref 3.5–5.1)
Protein, Total: 7.5 g/dL (ref 6.0–8.3)
Sodium: 138 mEq/L (ref 136–145)

## 2020-09-05 LAB — URINALYSIS REFLEX TO MICROSCOPIC EXAM - REFLEX TO CULTURE
Bilirubin, UA: NEGATIVE
Glucose, UA: NEGATIVE
Ketones UA: NEGATIVE
Leukocyte Esterase, UA: NEGATIVE
Nitrite, UA: NEGATIVE
Protein, UR: NEGATIVE
RBC, UA: 0 /hpf (ref 0–5)
Specific Gravity UA: 1.005 (ref 1.001–1.035)
Urine pH: 7 (ref 5.0–8.0)
Urobilinogen, UA: 0.2 mg/dL (ref 0.2–2.0)

## 2020-09-05 LAB — GLUCOSE WHOLE BLOOD - POCT: Whole Blood Glucose POCT: 98 mg/dL (ref 70–100)

## 2020-09-05 LAB — LIPASE: Lipase: 15 U/L (ref 8–78)

## 2020-09-05 LAB — TROPONIN I: Troponin I: <0.01 ng/mL (ref 0.00–0.05)

## 2020-09-05 LAB — COVID-19 (SARS-COV-2): SARS CoV 2 Overall Result: NEGATIVE

## 2020-09-05 LAB — GFR: EGFR: >60

## 2020-09-05 LAB — ETHANOL: Alcohol: NOT DETECTED mg/dL

## 2020-09-05 LAB — GROUP A STREP, RAPID ANTIGEN: Group A Strep, Rapid Antigen: NEGATIVE

## 2020-09-05 MED ORDER — FAMOTIDINE 20 MG PO TABS
20.0000 mg | ORAL_TABLET | Freq: Once | ORAL | Status: AC
Start: 2020-09-05 — End: 2020-09-05
  Administered 2020-09-05: 17:00:00 20 mg via ORAL
  Filled 2020-09-05: qty 1

## 2020-09-05 MED ORDER — LIDOCAINE VISCOUS HCL 2 % MT SOLN
10.0000 mL | Freq: Once | OROMUCOSAL | Status: AC
Start: 2020-09-05 — End: 2020-09-05
  Administered 2020-09-05: 17:00:00 10 mL via OROMUCOSAL
  Filled 2020-09-05: qty 15

## 2020-09-05 MED ORDER — ALUM & MAG HYDROXIDE-SIMETH 200-200-20 MG/5ML PO SUSP
30.0000 mL | Freq: Once | ORAL | Status: AC
Start: 2020-09-05 — End: 2020-09-05
  Administered 2020-09-05: 17:00:00 30 mL via ORAL
  Filled 2020-09-05: qty 30

## 2020-09-05 NOTE — ED Provider Notes (Signed)
Chief Complaint   Patient presents with    Chest Pain    Flu like symptoms       HPI  Eric [name] is a 29 y.o. male h/o IBS and gastritis who presents to the ED c/o epigastric pain and shakiness.    History obtained from patient via Arabic interpreter.    Patient states he developed some cold symptoms last night, and "thought he had the flu."  He endorses gradual onset of shaking chills, subjective fever, and sore throat.  This morning he states he developed gradual onset of epigastric pain at 11 am that radiated to his lower chest.  Describes it as similar quality to his prior episodes of gastritis.  He is prescribed omeprazole 20 mg daily, he states he only takes this occasionally, did not take his dose today.  He does endorse occasionally drinking alcohol, none recently.  He took a dose of ibuprofen last night, does not use this frequently.      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  He also states he only ate 1 orange today, it is currently 4 PM.  He states this is unusual for him he just had not had time to eat lunch.    No prior history of CAD or early family history of MI.  He does occasionally smoke tobacco.  No history of DVT/PE, recent travel or prolonged periods of immobilization, recent surgeries, or leg pain/swelling.  No history of aortic problems.     No associated headache, dizziness, syncope, neck pain/stiffness, shortness of breath, cough, nausea/vomiting/diarrhea, bloody stools, urinary symptoms, focal numbness/weakness, rashes, or any other complaints.    Reviewed nursing records: yes    Reviewed patients previous MEDICAL RECORD NUMBERyes.  Reviewed ED visit from 02/28/2020 patient presented for epigastric pain radiating to his chest after fasting.  Diagnosed with gastritis.  Reviewed ED visit from 05/15/2019 patient presented for  epigastric pain.  Had a history of dark stools after Pepto-Bismol.  Had a CT abdomen/pelvis with IV contrast that showed thickening of the gastric antrum likely secondary to gastroduodenitis.  Diagnosed with gastritis/PUD, symptoms resolved after GI cocktail.  Was discharged home with outpatient follow-up.  Reviewed ED visit from 08/21/2018, patient diagnosed with alcoholic gastritis.    Past Medical History   Past Medical History:   Diagnosis Date    Colon abnormality     IBS (irritable bowel syndrome)        Surgical History  History reviewed. No pertinent surgical history.    Outpatient Medications  Current Outpatient Medications   Medication Instructions    omeprazole (PRILOSEC) 20 mg, Oral, Daily        Allergies  No Known Allergies    Social History  Social History     Tobacco Use    Smoking status: Current Every Day Smoker     Packs/day: 0.50     Types: Cigarettes    Smokeless tobacco: Never Used   Vaping Use    Vaping Use: Never used   Substance Use Topics    Alcohol use: Not Currently     Comment: socially on weekends per pt    Drug use: Never       Family History  History reviewed. No pertinent family history.    ROS   All other organ systems were reviewed  and are negative except as stated in the HPI or noted below.    Physical Exam  When I was within 6 feet of this patient I donned the following PPE:  Surgical Mask: YES, Gloves: YES, Gown: NO; Goggles: YES; Face Shield: NO, 55M 6000 Respirator: NO; N95: YES.  The patient was wearing a mask during my evaluation: YES.    Vitals:    09/05/20 1602 09/05/20 1619 09/05/20 1719   BP: (!) 132/92 121/81 116/74   Pulse: 96 87 80   Resp: 18 17 22    Temp: 99.4 F (37.4 C)     TempSrc: Tympanic     SpO2: 99% 99% 99%   Weight: 69 kg     Height: 5\' 10"  (1.778 m)        GENERAL: awake, alert, in no acute distress, young male lying supine in bed, wearing a mask   HEAD: normocephalic, atraumatic.   EYES: conjunctiva clear, EOMI, no eye drainage   ENT: external ear  exam normal, nares patent with no drainage, mucous membranes moist, oropharynx mildly erythematous, no tonsillar hypertrophy or exudate noted, uvula midline  NECK: neck is supple, with full ROM.   CARDIOVASCULAR: normal rate, regular rhythm, no murmurs, extremities appear warm and well-perfused.   RESPIRATORY: no increased work of breathing, good air movement, breath sounds clear to auscultation bilaterally, no wheezes, rhonchi, or crackles noted.   GI/ABDOMEN: non-distended, normal bowel sounds, soft, non-tender, no rebound, no guarding   MUSCULOSKELETAL/EXTREMITIES: no deformities or cyanosis noted.   SKIN: warm and dry, no rashes.   NEURO: no focal neurologic deficits, moving all four extremities.   PSYCH: normal affect, answers questions appropriately.     Diagnostic Studies  Results     Procedure Component Value Units Date/Time    Lipase [[id number]] Collected: 09/05/20 1613    Specimen: Blood Updated: 09/05/20 1702     Lipase 15 U/L     Troponin I [[id number]] Collected: 09/05/20 1613    Specimen: Blood Updated: 09/05/20 1702     Troponin I <0.01 ng/mL     Ethanol (Alcohol) Level [[id number]] Collected: 09/05/20 1613    Specimen: Blood Updated: 09/05/20 1702     Alcohol None Detected mg/dL     GFR [[id number]] Collected: 09/05/20 1613     Updated: 09/05/20 1702     EGFR >60.0    Comprehensive metabolic panel [[id number]]  (Abnormal) Collected: 09/05/20 1613    Specimen: Blood Updated: 09/05/20 1702     Glucose 97 mg/dL      BUN 8.0 mg/dL      Creatinine 1.0 mg/dL      Sodium 664 mEq/L      Potassium 3.4 mEq/L      Chloride 106 mEq/L      CO2 24 mEq/L      Calcium 9.3 mg/dL      Protein, Total 7.5 g/dL      Albumin 3.9 g/dL      AST (SGOT) 13 U/L      ALT 10 U/L      Alkaline Phosphatase 64 U/L      Bilirubin, Total 1.1 mg/dL      Globulin 3.6 g/dL      Albumin/Globulin Ratio 1.1     Anion Gap 8.0    COVID-19 (SARS-CoV-2) ID Now [[id number]] Collected: 09/05/20 1625    Specimen: Nasopharyngeal Swab from Nasopharynx  Updated: 09/05/20 1702     Purpose of COVID testing Diagnostic -[facility]     SARS-CoV-2 Specimen Source Nasopharyngeal  SARS CoV 2 Overall Result Negative    Narrative:      o Collect and clearly label specimen type:  o Upper respiratory specimen: One Nasopharyngeal Dry Swab NO  Transport Media.  o Hand deliver to laboratory ASAP  Diagnostic -[facility]    Rapid Strep (Group A Antigen) [[id number]] Collected: 09/05/20 1625    Specimen: Throat Updated: 09/05/20 1658     Group A Strep, Rapid Antigen Negative    Rapid influenza A/B antigens [[id number]] Collected: 09/05/20 1625    Specimen: Nasopharyngeal Swab from Nasal Aspirate Updated: 09/05/20 1657    Narrative:      ORDER#: [id number]                                    ORDERED BY: Lenoria Farrier  SOURCE: Nasal Aspirate                               COLLECTED:  09/05/20 16:25  ANTIBIOTICS AT COLL.:                                RECEIVED :  09/05/20 16:44  Influenza Rapid Antigen A&B                FINAL       09/05/20 16:57  09/05/20   Negative for Influenza A and B             Reference Range: Negative      Urinalysis Reflex to Microscopic Exam- Reflex to Culture [[id number]]  (Abnormal) Collected: 09/05/20 1625    Specimen: Urine, Clean Catch Updated: 09/05/20 1643     Urine Type Urine, Clean Ca     Color, UA Yellow     Clarity, UA Clear     Specific Gravity UA 1.005     Urine pH 7.0     Leukocyte Esterase, UA Negative     Nitrite, UA Negative     Protein, UR Negative     Glucose, UA Negative     Ketones UA Negative     Urobilinogen, UA 0.2 mg/dL      Bilirubin, UA Negative     Blood, UA Trace     RBC, UA 0 -2 /hpf      WBC, UA 0 - 5 /hpf      Squamous Epithelial Cells, Urine 0 - 5 /hpf     Throat Culture [[id number]] Collected: 09/05/20 1625    Specimen: Throat Updated: 09/05/20 1626    CBC and differential [[id number]]  (Abnormal) Collected: 09/05/20 1613    Specimen: Blood Updated: 09/05/20 1622     WBC 9.75 x10 3/uL      Hgb 13.4 g/dL      Hematocrit 53.6 %      Platelets  248 x10 3/uL      RBC 5.98 x10 6/uL      MCV 71.7 fL      MCH 22.4 pg      MCHC 31.2 g/dL      RDW 16 %      MPV 11.9 fL      Neutrophils 77.8 %      Lymphocytes Automated 11.8 %      Monocytes 9.8 %      Eosinophils Automated 0.2 %      Basophils  Automated 0.2 %      Immature Granulocytes 0.2 %      Nucleated RBC 0.0 /100 WBC      Neutrophils Absolute 7.58 x10 3/uL      Lymphocytes Absolute Automated 1.15 x10 3/uL      Monocytes Absolute Automated 0.96 x10 3/uL      Eosinophils Absolute Automated 0.02 x10 3/uL      Basophils Absolute Automated 0.02 x10 3/uL      Immature Granulocytes Absolute 0.02 x10 3/uL      Absolute NRBC 0.00 x10 3/uL     Glucose Whole Blood - POCT [[id number]] Collected: 09/05/20 1608     Updated: 09/05/20 1618     Whole Blood Glucose POCT 98 mg/dL            Chest 2 Views   Final Result      Normal study.      Colonel Bald, MD    09/05/2020 4:32 PM          Medications administered in the ED  ED Medication Orders (From admission, onward)    Start Ordered     Status Ordering Provider    09/05/20 1630 09/05/20 1622  famotidine (PEPCID) tablet 20 mg  Once        Route: Oral  Ordered Dose: 20 mg     Last MAR action: Given Lenoria Farrier D    09/05/20 1630 09/05/20 1622  lidocaine viscous (XYLOCAINE) 2 % solution 10 mL  Once        Route: Mouth/Throat  Ordered Dose: 10 mL     Last MAR action: Given Lenoria Farrier D    09/05/20 1630 09/05/20 1622  alum & mag hydroxide-simethicone (MAALOX PLUS) 200-200-20 mg/5 mL suspension 30 mL  Once        Route: Oral  Ordered Dose: 30 mL     Last MAR action: Given Anelis Hrivnak D          Assessment and Plan  MDM:  29 y.o. male h/o IBS and gastritis seen in the ED for chills, sore throat, epigastric/chest pain.  Upon arrival to the ED, the pt was in no acute distress, VS were within normal limits however temp noted to be 99.4 F. Physical exam was notable as above.     Presentation at this time appears to be most consistent with viral syndrome (such as  COVID-19, influenza, viral pharyngitis).  Broad DDX initially considered on arrival including strep pharyngitis, GERD, PUD/gastritis, pancreatitis, PNA, pleural effusion, myocarditis, pericarditis, costochondritis, muscular chest wall strain.  Low suspicion for biliary etiology given no right upper quadrant tenderness, negative Murphy sign on exam. Low suspicion for ACS given heart score 1.  Low suspicion for aortic dissection given chest pain not ripping or tearing to the back, on exam pulses equal, no murmurs, no focal neurologic deficits noted.  Low suspicion for PE given PERC negative.    ED Course:  Point-of-care glucose 98.      I have independently visualized and interpreted the EKG which showed normal sinus rhythm, rate of 93, normal axis, first-degree AV block, no acute ischemic changes noted, appears similar when compared to prior EKG from 07/29/2020, first-degree AV block is not new.     I have independently visualized and interpreted the labs which were notable for potassium slightly low, urinalysis with trace blood but no signs of infection, labs o/w unremarkable including WBC 9, H&H wnl, troponin negative, EtOH not detected, lipase within normal limits.  Covid  negative, flu negative, strep negative.  Throat culture sent.     I have independently visualized and interpreted the imaging which showed CXR with no acute abnormalities including no evidence of pneumonia, free air under the diaphragm, or pleural effusions.     I counseled the patient face to face regarding tobacco products cessation for > 3 minutes and < 10 minutes.     Patient given Pepcid, GI cocktail. Upon re-eval, feeling much improved.  Tolerated p.o. challenge.    He was informed of all of his results via Arabic interpreter.  Given referral to [facility] cares clinic for follow-up.  We discussed that he may require ultimately a referral to GI for endoscopy given his recurrent episodes of gastritis.  Advised to avoid spicy or other irritating  foods, and to avoid NSAIDs.  He stated he had enough of his omeprazole prescription at home and did not require further medications.    It was felt that this patient was appropriate for discharge.Strict return precautions were given, patient expressed understanding. All questions and concerns were addressed, and this patient was discharged home in stable condition.       Diagnosis  Final diagnoses:   Epigastric pain   Gastritis without bleeding, unspecified chronicity, unspecified gastritis type   Chills   History of IBS       Bonney Aid, MD      This note was generated by the Epic EMR system/ Dragon speech recognition and may contain inherent errors or omissions not intended by the user. Grammatical errors, random word insertions, deletions and pronoun errors  are occasional consequences of this technology due to software limitations. Not all errors are caught or corrected. If there are questions or concerns about the content of this note or information contained within the body of this dictation they should be addressed directly with the author for clarification.

## 2020-09-05 NOTE — Discharge Instructions (Signed)
You are seen in the ER for upper abdominal pain/chest pain and chills.  Your work-up was reassuring, you tested negative for Covid, flu, and strep.  Your lab work showed a slightly low potassium which may be from not eating today, and your urine had a very small amount of blood.  Please have this rechecked with your PCP.  Please consider quitting smoking.  Please continue taking your omeprazole daily for likely gastritis.  Please try to avoid NSAID medications such as ibuprofen as this can further exacerbate your gastritis.  You were given a referral to [facility] cares clinic for follow-up.  Please return to the ER for any new or worsening symptoms.

## 2020-09-06 LAB — ECG 12-LEAD
Atrial Rate: 93 {beats}/min
P Axis: 73 degrees
P-R Interval: 218 ms
Q-T Interval: 314 ms
QRS Duration: 76 ms
QTC Calculation (Bezet): 390 ms
R Axis: 88 degrees
T Axis: 55 degrees
Ventricular Rate: 93 {beats}/min

## 2020-09-16 ENCOUNTER — Emergency Department
Admission: EM | Admit: 2020-09-16 | Discharge: 2020-09-16 | Disposition: A | Discharge status: Home / Self Care | Payer: Self-pay | Attending: Emergency Medicine | Admitting: Emergency Medicine

## 2020-09-16 DIAGNOSIS — R300 Dysuria: Secondary | ICD-10-CM | POA: Insufficient documentation

## 2020-09-16 LAB — BASIC METABOLIC PANEL
Anion Gap: 11 (ref 5.0–15.0)
BUN: 9 mg/dL (ref 9.0–28.0)
CO2: 24 mEq/L (ref 22–29)
Calcium: 9.5 mg/dL (ref 8.5–10.5)
Chloride: 105 mEq/L (ref 100–111)
Creatinine: 1.1 mg/dL (ref 0.7–1.3)
Glucose: 76 mg/dL (ref 70–100)
Potassium: 4 mEq/L (ref 3.5–5.1)
Sodium: 140 mEq/L (ref 136–145)

## 2020-09-16 LAB — GFR: EGFR: >60

## 2020-09-16 LAB — CBC AND DIFFERENTIAL
Absolute NRBC: 0 10*3/uL (ref 0.00–0.00)
Basophils Absolute Automated: 0.02 10*3/uL (ref 0.00–0.08)
Basophils Automated: 0.2 %
Eosinophils Absolute Automated: 0.06 10*3/uL (ref 0.00–0.44)
Eosinophils Automated: 0.7 %
Hematocrit: 44.1 % (ref 37.6–49.6)
Hgb: 13.6 g/dL (ref 12.5–17.1)
Immature Granulocytes Absolute: 0.02 10*3/uL (ref 0.00–0.07)
Immature Granulocytes: 0.2 %
Lymphocytes Absolute Automated: 1.62 10*3/uL (ref 0.42–3.22)
Lymphocytes Automated: 19.7 %
MCH: 22.4 pg — ABNORMAL LOW (ref 25.1–33.5)
MCHC: 30.8 g/dL — ABNORMAL LOW (ref 31.5–35.8)
MCV: 72.5 fL — ABNORMAL LOW (ref 78.0–96.0)
MPV: 11.5 fL (ref 8.9–12.5)
Monocytes Absolute Automated: 0.56 10*3/uL (ref 0.21–0.85)
Monocytes: 6.8 %
Neutrophils Absolute: 5.95 10*3/uL (ref 1.10–6.33)
Neutrophils: 72.4 %
Nucleated RBC: 0 /100 WBC (ref 0.0–0.0)
Platelets: 323 10*3/uL (ref 142–346)
RBC: 6.08 10*6/uL — ABNORMAL HIGH (ref 4.20–5.90)
RDW: 16 % — ABNORMAL HIGH (ref 11–15)
WBC: 8.23 10*3/uL (ref 3.10–9.50)

## 2020-09-16 LAB — URINALYSIS REFLEX TO MICROSCOPIC EXAM - REFLEX TO CULTURE
Bilirubin, UA: NEGATIVE
Blood, UA: NEGATIVE
Glucose, UA: NEGATIVE
Ketones UA: NEGATIVE
Leukocyte Esterase, UA: NEGATIVE
Nitrite, UA: NEGATIVE
Protein, UR: NEGATIVE
Specific Gravity UA: 1.006 (ref 1.001–1.035)
Urine pH: 7 (ref 5.0–8.0)
Urobilinogen, UA: NORMAL mg/dL (ref 0.2–2.0)

## 2020-09-16 NOTE — ED Notes (Signed)
I have assisted in the ordering of diagnostic studies necessary to expedite care. I am not the primary provider for this patient. Please refer to the provider/reassessment/disposition notes for further information about this patients emergency department course.       Alyson Reedy, MD  09/16/20 1729

## 2020-09-16 NOTE — ED Provider Notes (Signed)
[place] [facility] EMERGENCY DEPARTMENT H&P      Visit date: 09/16/2020      CLINICAL SUMMARY           Diagnosis:    .     Final diagnoses:   Dysuria         MDM Notes:      Based on the patient's history evaluation unclear as the etiology of his symptoms today.  Exam was benign.  Urinalysis was obtained, no evidence of UTI.  Urinalysis was also sent for GC/chlamydia, patient was advised to be informed of this.  Patient is being discharged stable condition.         Disposition:         Discharge               Discharge Prescriptions     None                         CLINICAL INFORMATION        HPI:      Chief Complaint: Dysuria and Abdominal Pain  .    Eric [name] is a 29 y.o. male w/ PMHx of IBS who presents with moderately intermittent diffuse lower abdominal pain since yesterday. Pt reports experiencing pain after urinating, burning sensation. Pt endorses diarrhea starting yesterday. Pt denies noticing any discharge from his penis. Pt reports no hx of surgeries. No known allergies. Pt endorses smoking and drinking sometimes. Pt is circumcised. Pt is not COVID vaccinated but states that he has an appointment to receive vaccine this week. Denies fevers, nausea, and vomiting.    History obtained from: Patient  Video interpreter used.          ROS:      Positive and negative ROS elements as per HPI.  All other systems reviewed and negative.      Physical Exam:      Pulse 99   BP 134/74   Resp 18   SpO2 100 %   Temp 98.1 F (36.7 C)      Constitutional: Overweight male, laying on a gurney, in no acute distress   Head: Normocephalic, atraumatic  Eyes: EOMI.  Sclera not pale, injected.  ENT: Mucous membranes moist.  Nose symmetric.  Neck: Normal range of motion. Non-tender.  Respiratory/Chest: Clear to auscultation. No respiratory distress.   Cardiovascular: RRR w/o murmur/gallop  Abdomen: Soft and non-tender. No guarding. No masses or hepatosplenomegaly.  Genitourinary: Normal  appearing circumcised male genitalia. Mild TTP over surface of bladder.  Musculoskeletal: No edema. No cyanosis.  Neurological: CN II-XII intact grossly. No focal motor deficits by observation. Speech normal.  Skin: Warm and dry. No rash.  Psychiatric: Normal affect. Normal concentration.              PAST HISTORY        Primary Care Provider: Pcp, None, MD        PMH/PSH:    .     Past Medical History:   Diagnosis Date    Colon abnormality     IBS (irritable bowel syndrome)        He has no past surgical history on file.      Social/Family History:      He reports that he has been smoking cigarettes. He has been smoking about 0.50 packs per day. He has never used smokeless tobacco. He reports previous alcohol use. He reports that he does not use drugs.  History reviewed. No pertinent family history.      Listed Medications on Arrival:    .     Home Medications     Med List Status: In Progress Set By: Jonette Mate, RN at 09/16/2020  6:58 PM                omeprazole (PriLOSEC) 20 MG capsule     Take 20 mg by mouth daily         Allergies: He has No Known Allergies.            VISIT INFORMATION        Clinical Course in the ED:                 Medications Given in the ED:    .     ED Medication Orders (From admission, onward)    None            Procedures:      Procedures      Interpretations:                     RESULTS        Lab Results:      Results     Procedure Component Value Units Date/Time    Urine Chlamydia/Neisseria by PCR [[id number]] Collected: 09/16/20 1932    Specimen: Urine Updated: 09/16/20 1932    Narrative:      First Catch Urine specimen submitted in [facility] [facility]-  Special collection Kit    UA Reflex to Micro - Reflex to Culture [[id number]] Collected: 09/16/20 1748    Specimen: Urine, Clean Catch Updated: 09/16/20 1825     Urine Type Urine, Clean Ca     Color, UA Straw     Clarity, UA Clear     Specific Gravity UA 1.006     Urine pH 7.0     Leukocyte Esterase, UA Negative     Nitrite, UA  Negative     Protein, UR Negative     Glucose, UA Negative     Ketones UA Negative     Urobilinogen, UA Normal mg/dL      Bilirubin, UA Negative     Blood, UA Negative    CBC and differential [[id number]]  (Abnormal) Collected: 09/16/20 1748    Specimen: Blood Updated: 09/16/20 1825     WBC 8.23 x10 3/uL      Hgb 13.6 g/dL      Hematocrit 78.4 %      Platelets 323 x10 3/uL      RBC 6.08 x10 6/uL      MCV 72.5 fL      MCH 22.4 pg      MCHC 30.8 g/dL      RDW 16 %      MPV 11.5 fL      Neutrophils 72.4 %      Lymphocytes Automated 19.7 %      Monocytes 6.8 %      Eosinophils Automated 0.7 %      Basophils Automated 0.2 %      Immature Granulocytes 0.2 %      Nucleated RBC 0.0 /100 WBC      Neutrophils Absolute 5.95 x10 3/uL      Lymphocytes Absolute Automated 1.62 x10 3/uL      Monocytes Absolute Automated 0.56 x10 3/uL      Eosinophils Absolute Automated 0.06 x10 3/uL      Basophils Absolute Automated  0.02 x10 3/uL      Immature Granulocytes Absolute 0.02 x10 3/uL      Absolute NRBC 0.00 x10 3/uL     Basic Metabolic Panel [[id number]] Collected: 09/16/20 1748    Specimen: Blood Updated: 09/16/20 1816     Glucose 76 mg/dL      BUN 9.0 mg/dL      Creatinine 1.1 mg/dL      Calcium 9.5 mg/dL      Sodium 409 mEq/L      Potassium 4.0 mEq/L      Chloride 105 mEq/L      CO2 24 mEq/L      Anion Gap 11.0    GFR [[id number]] Collected: 09/16/20 1748     Updated: 09/16/20 1816     EGFR >60.0              Radiology Results:      No orders to display               Scribe Attestation:      [name] was acting as a scribe for Samantha Ragen, Louanne Belton., MD on Jackey Loge [name]   Treatment Team: Scribe: Rudean Curt     [name] am the first provider for this patient and [name] personally performed the services documented. Treatment Team: Scribe: Rudean Curt is scribing for me on [employer],Eric [name] .This note and the patient instructions accurately reflect work and decisions made by me.  Allyah Heather, Louanne Belton., MD

## 2020-09-16 NOTE — Discharge Instructions (Signed)
Dear [name]:    Thank you for choosing the [facility] Emergency Department, the premier emergency department in the [place] area.  I hope your visit today was EXCELLENT. You will receive a survey via text message that will give you the opportunity to provide feedback to your team about your visit. Please do not hesitate to reach out with any questions!    Specific instructions for your visit today:      Dysuria    You have been seen for dysuria.     Dysuria is the medical term for painful urination. There are several causes and they include:   Bladder infection.   Sexually transmitted diseases (STDs). These include gonorrhea, chlamydia, syphilis, herpes and others).   Prostate problem.   Kidney stones.   Bladder, prostate or kidney cancer (these are rare).    Although the cause of your pain and discomfort is not yet known, we believe it is OK for you to go home.    The doctor will decide whether to prescribe pain medicine.    Samples were obtained to check for STDs. You will be informed of the results in a few days.    Follow up with your family doctor to try to find a cause for your pain.    YOU SHOULD SEEK MEDICAL ATTENTION IMMEDIATELY, EITHER HERE OR AT THE NEARST EMERGENCY DEPARTMENT IF ANY OF THE FOLLOWING OCCURS:   You have pain in your belly, pelvis or back.   You have fever (temperature higher than 100.75F / 38C) that wont go away.   You have vomiting that does not stop after a few times.   You are unable to urinate.   You have any other concerns.                 IF YOU DO NOT CONTINUE TO IMPROVE OR YOUR CONDITION WORSENS, PLEASE CONTACT YOUR DOCTOR OR RETURN IMMEDIATELY TO THE EMERGENCY DEPARTMENT.    Sincerely,  Zaidyn Claire, Louanne Belton., MD  Attending Emergency Physician  [facility] Emergency Department    ONSITE PHARMACY  Our full service onsite pharmacy is located in the ER waiting room.  Open 7 days a week from 9 am to 9 pm.  We accept all major insurances and  prices are competitive with major retailers.  Ask your provider to print your prescriptions down to the pharmacy to speed you on your way home.    OBTAINING A PRIMARY CARE APPOINTMENT    Primary care physicians (PCPs, also known as primary care doctors) are either internists or family medicine doctors. Both types of PCPs focus on health promotion, disease prevention, patient education and counseling, and treatment of acute and chronic medical conditions.    If you need a primary care doctor, please call the below number and ask who is receiving new patients.     [facility]  Telephone:  [phone]  [email or web address]    DOCTOR REFERRALS  Call [phone] (available 24 hours a day, 7 days a week) if you need any further referrals and we can help you find a primary care doctor or specialist.  Also, available online at:  [email or web address]    YOUR CONTACT INFORMATION  Before leaving please check with registration to make sure we have an up-to-date contact number.  You can call registration at [phone] to update your information.  For questions about your hospital bill, please call [phone].  For questions about your Emergency Dept Physician bill please call [phone].  FREE HEALTH SERVICES  If you need help with health or social services, please call 2-1-1 for a free referral to resources in your area.  2-1-1 is a free service connecting people with information on health insurance, free clinics, pregnancy, mental health, dental care, food assistance, housing, and substance abuse counseling.  Also, available online at:  http://www.211[place].org    MEDICAL RECORDS AND TESTS  Certain laboratory test results do not come back the same day, for example urine cultures.   We will contact you if other important findings are noted.  Radiology films are often reviewed again to ensure accuracy.  If there is any discrepancy, we will notify you.      Please call [phone] to  pick up a complimentary CD of any radiology studies performed.  If you or your doctor would like to request a copy of your medical records, please call [phone].      ORTHOPEDIC INJURY   Please know that significant injuries can exist even when an initial x-ray is read as normal or negative.  This can occur because some fractures (broken bones) are not initially visible on x-rays.  For this reason, close outpatient follow-up with your primary care doctor or bone specialist (orthopedist) is required.    MEDICATIONS AND FOLLOWUP  Please be aware that some prescription medications can cause drowsiness.  Use caution when driving or operating machinery.    The examination and treatment you have received in our Emergency Department is provided on an emergency basis, and is not intended to be a substitute for your primary care physician.  It is important that your doctor checks you again and that you report any new or remaining problems at that time.      24 HOUR PHARMACIES  The nearest 24 hour pharmacy is:    [facility]  [address]  [place], [place] [address]  [phone]      ASSISTANCE WITH INSURANCE    Affordable Care Act  [facility])  Call to start or finish an application, compare plans, enroll or ask a question.  [phone]  TTY: 469-070-1101  Web:  Healthcare.gov    Help Enrolling in [facility]  [employer] [place]  (534) 350-9740 (TOLL-FREE)  4036476451 (TTY)  Web:  Http://www.coverva.org    Local Help Enrolling in the [facility]  [place] [facility]  216-092-2302 (MAIN)  Email:  health-help@nvfs .org  Web:  BlackjackMyths.is  Address:  [address] [place], [place] [address]    SEDATING MEDICATIONS  Sedating medications include strong pain medications (e.g. narcotics), muscle relaxers, benzodiazepines (used for anxiety and as muscle relaxers), Benadryl/diphenhydramine and other antihistamines for allergic reactions/itching, and other medications.  If you are unsure  if you have received a sedating medication, please ask your physician or nurse.  If you received a sedating medication: DO NOT drive a car. DO NOT operate machinery. DO NOT perform jobs where you need to be alert.  DO NOT drink alcoholic beverages while taking this medicine.     If you get dizzy, sit or lie down at the first signs. Be careful going up and down stairs.  Be extra careful to prevent falls.     Never give this medicine to others.     Keep this medicine out of reach of children.     Do not take or save old medicines. Throw them away when outdated.     Keep all medicines in a cool, dry place. DO NOT keep them in  your bathroom medicine cabinet or in a cabinet above the stove.    MEDICATION REFILLS  Please be aware that we cannot refill any prescriptions through the ER. If you need further treatment from what is provided at your ER visit, please follow up with your primary care doctor or your pain management specialist.    [place]  Did you know [facility] has two freestanding ERs located just a few miles away?  [facility] [place] [facility] [place] have short wait times, easy free parking directly in front of the building and top patient satisfaction scores - and the same Board Certified Emergency Medicine doctors as [facility].

## 2020-09-16 NOTE — EDIE (Signed)
COLLECTIVE?NOTIFICATION?09/16/2020 17:20?Weekes, Mychal A?MRN: [id number]    Criteria Met      3 Different Facilities in 90 Days    Security and Safety  No recent Security Events currently on file    ED Care Guidelines  There are currently no ED Care Guidelines for this patient. Please check your facility's medical records system.    Flags      Negative COVID-19 Lab Result - [facility] - A specimen collected from this patient was negative for COVID-19 / Attributed By: [place] Department of Health / Attributed On: 09/07/2020       Prescription Monitoring Program  000??- Narcotic Use Score  000??- Sedative Use Score  000??- Stimulant Use Score  000??- Overdose Risk Score  - All Scores range from 000-999 with 75% of the population scoring < 200 and on 1% scoring above 650  - The last digit of the narcotic, sedative, and stimulant score indicates the number of active prescriptions of that type  - Higher Use scores correlate with increased prescribers, pharmacies, mg equiv, and overlapping prescriptions  - Higher Overdose Risk Scores correlate with increased risk of unintentional overdose death   Concerning or unexpectedly high scores should prompt a review of the PMP record; this does not constitute checking PMP for prescribing purposes.      E.D. Visit Count (12 mo.)  Facility Visits   [place] [facility]: [place] 1   [place] [facility] 2   [facility] 1   Total 4   Note: Visits indicate total known visits.     Recent Emergency Department Visit Summary  Date Facility [facility] Type Diagnoses or Chief Complaint   Sep 16, 2020 [place] [place] [facility]      triage a      Sep 05, 2020 [place] [facility]: [place] [facility]      chest pain      Flu like symptoms      Personal history of other diseases of the digestive system      Gastritis, unspecified, without bleeding      Epigastric pain      Chills (without fever)      Jul 29, 2020 [place] [place] [facility]       acid reflux      Heartburn      Gastro-esophageal reflux disease without esophagitis      Feb 28, 2020 [place] [facility] [place] [facility]      abd pain      Upper abdominal pain, unspecified          Recent Inpatient Visit Summary  No recorded inpatient visits.     Care Team  There is not a care team on record at this time.   Collective Portal  This patient has registered at the [facility] Emergency Department   For more information visit: [email or web address]     PLEASE NOTE:     1.   Any care recommendations and other clinical information are provided as guidelines or for historical purposes only, and providers should exercise their own clinical judgment when providing care.    2.   You may only use this information for purposes of treatment, payment or health care operations activities, and subject to the limitations of applicable Collective Policies.    3.   You should consult directly with the organization that provided a care guideline or other clinical history with any questions about additional  information or accuracy or completeness of information provided.    ? 2021 [employer] - www.collectivemedical.com

## 2020-09-17 LAB — URINE CHLAMYDIA/NEISSERIA BY PCR
Chlamydia DNA by PCR: NEGATIVE
Neisseria gonorrhoeae by PCR: NEGATIVE

## 2021-09-28 ENCOUNTER — Inpatient Hospital Stay
Admit: 2021-09-28 | Discharge: 2021-09-28 | Disposition: A | Discharge status: Home / Self Care | Attending: Emergency Medicine

## 2021-09-28 DIAGNOSIS — R1013 Epigastric pain: Secondary | ICD-10-CM

## 2021-09-28 MED ORDER — ONDANSETRON 4 MG TAB, RAPID DISSOLVE
4 mg | ORAL | Status: AC
Start: 2021-09-28 — End: 2021-09-28
  Administered 2021-09-28: 17:00:00 via ORAL

## 2021-09-28 MED ORDER — ALUM-MAG HYDROXIDE-SIMETH 200 MG-200 MG-20 MG/5 ML ORAL SUSP
200-200-20 mg/5 mL | Freq: Once | ORAL | Status: AC
Start: 2021-09-28 — End: 2021-09-28
  Administered 2021-09-28: 17:00:00 via ORAL

## 2021-09-28 MED ORDER — ONDANSETRON 4 MG TAB, RAPID DISSOLVE
4 mg | ORAL_TABLET | Freq: Three times a day (TID) | ORAL | 0 refills | Status: AC | PRN
Start: 2021-09-28 — End: ?

## 2021-09-28 MED ORDER — LIDOCAINE 2 % MUCOSAL SOLN
2 % | Status: AC
Start: 2021-09-28 — End: 2021-09-28
  Administered 2021-09-28: 17:00:00 via OROMUCOSAL

## 2021-09-28 MED FILL — LIDOCAINE 2 % MUCOSAL SOLN: 2 % | Qty: 15

## 2021-09-28 MED FILL — ONDANSETRON 4 MG TAB, RAPID DISSOLVE: 4 mg | ORAL | Qty: 1

## 2021-09-28 MED FILL — MAG-AL PLUS 200 MG-200 MG-20 MG/5 ML ORAL SUSPENSION: 200-200-20 mg/5 mL | ORAL | Qty: 30

## 2021-09-28 NOTE — ED Notes (Signed)
 Presents with abdominal pain states  I believe its from the alcohol I drank last night.  Denies n/v

## 2021-09-28 NOTE — ED Provider Notes (Signed)
ED Provider Notes by Cecil Cranker, MD at 09/28/21 1141                Author: Cecil Cranker, MD  Service: Emergency Medicine  Author Type: Physician       Filed: 09/28/21 1259  Date of Service: 09/28/21 1141  Status: Signed          Editor: Cecil Cranker, MD (Physician)               EMERGENCY DEPARTMENT HISTORY AND PHYSICAL EXAM           Date: 09/28/2021   Patient Name: Gavin Conner        History of Presenting Illness          Chief Complaint       Patient presents with        ?  Abdominal Pain           History Provided By: Patient      HPI: Gavin Conner, 30 y.o. male presents to the emergency department complaint of epigastric abdominal pain x1 day.  Patient reports the pain is  sharp and burning in character, moderate in severity, intermittent without noted aggravating relieving factors.  Patient reports associated nausea without vomiting.  Patient reports history of the same.  Patient denies fevers or chills.  Patient reports  he drank alcohol last night and believes that is what is causing his pain, reports history of reflux and gastritis.  Patient denies radiation of the pain.      There are no other complaints, changes, or physical findings at this time.      PCP: No primary care provider on file.        No current facility-administered medications on file prior to encounter.          No current outpatient medications on file prior to encounter.             Past History        Past Medical History:   No past medical history on file.      Past Surgical History:   No past surgical history on file.      Family History:   No family history on file.      Social History:          Allergies:   No Known Allergies        Review of Systems     Review of Systems   Review of Systems    Constitutional: Negative for chills and fever.    HENT: Negative for sinus pressure and sinus pain.     Eyes: Negative for photophobia and redness.    Respiratory: Negative for shortness of breath  and wheezing.     Cardiovascular: Negative for chest pain and palpitations.    Gastrointestinal: Positive for abdominal pain and nausea.    Genitourinary: Negative for flank pain and hematuria.    Musculoskeletal: Negative for arthralgias and gait problem.    Skin: Negative for color change and pallor.    Neurological: Negative for dizziness and weakness.         Physical Exam     Physical Exam   Physical Exam   Constitutional:        General: No acute distress.     Appearance: Normal appearance.  Not toxic-appearing.   HENT:       Head: Normocephalic and atraumatic.      Nose: Nose normal.  Mouth/Throat:      Mouth: Mucous membranes are moist.   Eyes :       Extraocular Movements: Extraocular movements intact.      Pupils: Pupils are equal, round, and reactive to light.   Cardiovascular :       Rate and Rhythm: Normal rate.      Pulses: Normal pulses.   Pulmonary:       Effort: Pulmonary effort is normal.      Breath sounds: No stridor.   Abdominal:      General: Abdomen is flat. There is no  distension.  Mild epigastric tenderness to palpation without guarding or rebound.   Musculoskeletal:          General: Normal range of motion.      Cervical back: Normal range of motion and neck supple.    Skin:      General: Skin is warm and dry.      Capillary Refill: Capillary refill takes less than 2 seconds.   Neurological:       General: No focal deficit present.      Mental Status: Aert and oriented to person, place, and time.   Psychiatric:          Mood and Affect: Mood normal.         Behavior: Behavior normal.         Lab and Diagnostic Study Results     Labs -    No results found for this or any previous visit (from the past 12 hour(s)).      Radiologic Studies -    @lastxrresult @     CT Results  (Last 48 hours)             None                    CXR Results  (Last 48 hours)             None                       Medical Decision Making and ED Course     Differential Diagnosis & Medical Decision Making  Provider Note:          - I am the first provider for this patient.  I reviewed the vital signs, available nursing notes, past medical history, past surgical history, family history and social history. The patients presenting problems have been discussed, and they are in agreement  with the care plan formulated and outlined with them.  I have encouraged them to ask questions as they arise throughout their visit.      Vital Signs-Reviewed the patient's vital signs.   Patient Vitals for the past 12 hrs:            Temp  Pulse  Resp  BP  SpO2            09/28/21 1125  97.8 ??F (36.6 ??C)  89  16  130/87  100 %           E        Disposition     Disposition: DC- Adult Discharges: All of the diagnostic tests were reviewed and questions answered. Diagnosis, care plan  and treatment options were discussed.  The patient understands the instructions and will follow up as directed. The patients results have been reviewed with them.  They have been counseled regarding their diagnosis.  The patient verbally convey  understanding  and agreement of the signs, symptoms, diagnosis, treatment and prognosis and additionally agrees to follow up as recommended with their PCP in 24 - 48 hours.  They also agree with the care-plan and convey that all of their questions have been answered.   I have also put together some discharge instructions for them that include: 1) educational information regarding their diagnosis, 2) how to care for their diagnosis at home, as well a 3) list of reasons why they would want to return to the ED prior to  their follow-up appointment, should their condition change.      DISCHARGE PLAN:   1. There are no discharge medications for this patient.      2.      Follow-up Information      None             3.  Return to ED if worse    4. There are no discharge medications for this patient.              Diagnosis/Clinical Impression        Clinical Impression:       1.  Abdominal pain, epigastric             Attestations: ICecil Cranker, MD, am the primary clinician of record.      Please note that this dictation was completed with Dragon, the computer voice recognition software.  Quite often unanticipated grammatical, syntax, homophones, and other interpretive errors are inadvertently  transcribed by the computer software.  Please disregard these errors.  Please excuse any errors that have escaped final proofreading.  Thank you.

## 2021-12-27 ENCOUNTER — Emergency Department
Admission: EM | Admit: 2021-12-27 | Discharge: 2021-12-27 | Disposition: A | Discharge status: Home / Self Care | Payer: Self-pay | Attending: Emergency Medicine | Admitting: Emergency Medicine

## 2021-12-27 DIAGNOSIS — Z20822 Contact with and (suspected) exposure to covid-19: Secondary | ICD-10-CM | POA: Insufficient documentation

## 2021-12-27 DIAGNOSIS — R509 Fever, unspecified: Secondary | ICD-10-CM | POA: Insufficient documentation

## 2021-12-27 DIAGNOSIS — F17210 Nicotine dependence, cigarettes, uncomplicated: Secondary | ICD-10-CM | POA: Insufficient documentation

## 2021-12-27 LAB — COVID-19 (SARS-COV-2) & INFLUENZA  A/B, NAA (ROCHE LIAT)
Influenza A: NOT DETECTED
Influenza B: NOT DETECTED
SARS CoV 2 Overall Result: NOT DETECTED

## 2021-12-27 MED ORDER — IBUPROFEN 600 MG PO TABS
600.0000 mg | ORAL_TABLET | Freq: Once | ORAL | Status: AC
Start: 2021-12-27 — End: 2021-12-27
  Administered 2021-12-27: 600 mg via ORAL
  Filled 2021-12-27: qty 1

## 2021-12-27 MED ORDER — KETOROLAC TROMETHAMINE 30 MG/ML IJ SOLN
30.0000 mg | Freq: Once | INTRAMUSCULAR | Status: AC
Start: 2021-12-27 — End: 2021-12-27
  Administered 2021-12-27: 30 mg via INTRAMUSCULAR
  Filled 2021-12-27: qty 1

## 2021-12-27 MED ORDER — ACETAMINOPHEN 500 MG PO TABS
1000.0000 mg | ORAL_TABLET | Freq: Once | ORAL | Status: AC
Start: 2021-12-27 — End: 2021-12-27
  Administered 2021-12-27: 1000 mg via ORAL
  Filled 2021-12-27: qty 2

## 2021-12-27 NOTE — EDIE (Signed)
COLLECTIVE?NOTIFICATION?12/27/2021 19:15?Eric [name], Eric [name]?MRN: [id number]    Criteria Met      5 ED Visits in 12 Months    3 Different Facilities in 90 Days    Security and Safety  No Security Events were found.  ED Care Guidelines  There are currently no ED Care Guidelines for this patient. Please check your facility's medical records system.        Prescription Drug Data  No Prescription Drug Data was found.    E.D. Visit Count (12 mo.)  Facility Visits   [facility] 1   [facility] 5   [place] [facility] 1   [facility] 1   Total 8   Note: Visits indicate total known visits.     Recent Emergency Department Visit Summary  Date Facility [facility] Type Diagnoses or Chief Complaint    Dec 27, 2021  [facility]: [facility]  [place]  Emergency      Cough, Weakness      Oct 20, 2021  [facility] [place] [facility].  [place]  [place]  Emergency      Gastritis, unspecified, without bleeding      Sep 28, 2021  [facility]  [place]  [place]  Emergency      Epigastric pain      Aug 31, 2021  [facility] [place] [facility].  [place]  [place]  Emergency      Cough, unspecified      May 22, 2021  [facility] [place] [facility] H.  [place]  [place]  Emergency      Other fatigue      Weakness      Contact with and (suspected) exposure to COVID-19      May 21, 2021  [facility]  [place]  Emergency      VOMITING, SHARP PAIN FROM NECK TO CHEST AND FATIGE      Headache      Abdominal Pain      Vomiting      Nausea      Chest Pain      Weakness - Generalized      Mar 01, 2021  [facility] [facility] H.  [place]  Emergency      Residual hemorrhoidal skin tags      Feb 05, 2021  [facility] [place] [facility] H.  [place]  [place]  Emergency      Unspecified abdominal pain      Nicotine dependence, unspecified, uncomplicated        Recent Inpatient Visit Summary  No Recent Inpatient Visits were found.  Care Team  No  Care Team was found.  Collective Portal  This patient has registered at the [facility] Emergency Room: [facility] Emergency Department   For more information visit: [email or web address]     PLEASE NOTE:     1.   Any care recommendations and other clinical information are provided as guidelines or for historical purposes only, and providers should exercise their own clinical judgment when providing care.    2.   You may only use this information for purposes of treatment, payment or health care operations activities, and subject to the limitations of applicable Collective Policies.    3.   You should consult directly with the organization that provided [name] care guideline or other clinical history with any questions  about additional information or accuracy or completeness of information provided.    ? 2023 [employer] - www.collectivemedical.com

## 2021-12-27 NOTE — ED Notes (Signed)
Pt AA&Ox4, answering questions appropriately, able to speak in complete sentences, speech clear, resp even & unlabored, no accessory muscle use noted, ambulatory w/ steady gait, in NAD. Pt condition favorable for discharge. Pt shows no signs of distress at this time. Discharge and followup instructions given and time allowed for Q&As. Pt informed to come back or go to nearest ER if sx returns or worsens.

## 2021-12-27 NOTE — ED Provider Notes (Signed)
EMERGENCY DEPARTMENT NOTE     Patient initially seen and examined at   ED PHYSICIAN ASSIGNED       Date/Time Event User Comments    12/27/21 1918 Physician Assigned Bayard Beaver, MD assigned as Attending           ED MIDLEVEL (APP) ASSIGNED       None            HISTORY OF PRESENT ILLNESS   Independent Historian:No  Translator Used: mvtranslator: no    Chief Complaint: Cough and Generalized Body Aches       31 y.o. male who presents today for several days of fever cough congestion arthralgias and myalgias.  He is taking Tylenol with minimal improvement in his symptoms.  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    MEDICAL HISTORY     Past Medical History:  Past Medical History:   Diagnosis Date    Colon abnormality     IBS (irritable bowel syndrome)        Past Surgical History:  History reviewed. No pertinent surgical history.    Social History:  Social History     Socioeconomic History    Marital status: Single   Tobacco Use    Smoking status: Every Day     Packs/day: 0.50     Types: Cigarettes    Smokeless tobacco: Never   Vaping Use    Vaping Use: Never used   Substance and Sexual Activity    Alcohol use: Not Currently     Comment: socially on weekends per pt    Drug use: Never   Social History Narrative    ** Merged History Encounter **            Family History:  History reviewed. No pertinent family history.    Outpatient Medication:  Discharge Medication List as of 12/27/2021  9:03 PM        CONTINUE these medications which have NOT CHANGED    Details   omeprazole (PriLOSEC) 20 MG capsule Take 20 mg by mouth daily, Historical Med               REVIEW OF SYSTEMS   Review of Systems   Constitutional:  Positive for fever. Negative for chills and fatigue.   HENT:  Positive for congestion.    Eyes:  Negative for visual disturbance.   Respiratory:  Positive for cough. Negative for shortness of breath.    Cardiovascular:  Negative for chest pain.   Gastrointestinal:  Negative for  nausea and vomiting.   Genitourinary:  Negative for dysuria.   Musculoskeletal:  Positive for arthralgias and myalgias.   Skin:  Negative for rash.   Allergic/Immunologic: Negative for immunocompromised state.   Neurological:  Negative for dizziness.   Psychiatric/Behavioral:  Negative for suicidal ideas.   See History of Present Illness  PHYSICAL EXAM     ED Triage Vitals   Enc Vitals Group      BP 12/27/21 1922 128/81      Heart Rate 12/27/21 1922 (!) 104      Resp Rate 12/27/21 1922 19      Temp 12/27/21 1931 98.2 F (36.8 C)      Temp Source 12/27/21 1922 Oral      SpO2 12/27/21 1922 100 %      Weight 12/27/21 1921 70.4 kg      Height 12/27/21 1921 1.829 m      Head Circumference --  Peak Flow --       Pain Score 12/27/21 1922 9      Pain Loc --       Pain Edu? --       Excl. in GC? --        Physical Exam  Vitals reviewed.   Constitutional:       Appearance: Normal appearance.   HENT:      Head: Normocephalic.      Nose: No congestion.   Eyes:      Conjunctiva/sclera: Conjunctivae normal.   Cardiovascular:      Rate and Rhythm: Normal rate and regular rhythm.   Pulmonary:      Effort: Pulmonary effort is normal.      Breath sounds: Normal breath sounds.   Abdominal:      Palpations: Abdomen is soft.      Tenderness: There is no abdominal tenderness. There is no guarding.   Musculoskeletal:         General: No deformity.      Cervical back: Neck supple.   Skin:     General: Skin is warm and dry.   Neurological:      General: No focal deficit present.      Mental Status: He is alert.   Psychiatric:         Mood and Affect: Mood normal.         MEDICAL DECISION MAKING     PRIMARY PROBLEM LIST     Acute illness/injury Moderate Viral syndrome DIAGNOSIS  Chronic Illness Impacting Care of the above problem: N/A N/A  Differential Diagnosis: Fever: URI, flu, UTI, pneumonia, sepsis, cellulitis, decub ulcer, myocarditis, COVID19   DISCUSSION      Medical Decision Making  31 year old male presents today for  evaluation of viral symptoms.  COVID flu negative.  Suspect other viral etiology.  Discharge home with supportive care primary care follow-up, return precautions discussed and all questions answered.    Amount and/or Complexity of Data Reviewed  Labs: ordered.    Risk  OTC drugs.  Prescription drug management.      I do not suspect severe sepsis or septic shock      Vital Signs: Reviewed the patient's vital signs.   Nursing Notes: Reviewed and utilized available nursing notes.  Medical Records Reviewed: Reviewed available past medical records.  Counseling: The emergency provider has spoken with the patient and discussed today's findings, in addition to providing specific details for the plan of care.  Questions are answered and there is agreement with the plan.      CARDIAC STUDIES    The following cardiac studies were independently interpreted by me the Emergency Medicine Provider.  For full cardiac study results please see chart.    Monitor Strip  Interpreted by ED Provider  Rate: 87  Rhythm: NSR   ST Changes: none    EKG Interpretation:  Signed and interpreted by ED Provider         RADIOLOGY IMAGING STUDIES      No orders to display       EMERGENCY DEPT. MEDICATIONS      ED Medication Orders (From admission, onward)      Start Ordered     Status Ordering Provider    12/27/21 2111 12/27/21 2110  ketorolac (TORADOL) injection 30 mg  Once        Route: Intramuscular  Ordered Dose: 30 mg     Last MAR action: Given Caela Huot A  12/27/21 2042 12/27/21 2041  acetaminophen (TYLENOL) tablet 1,000 mg  Once        Route: Oral  Ordered Dose: 1,000 mg     Last MAR action: Given Kamerin Grumbine A    12/27/21 1932 12/27/21 1931  ibuprofen (ADVIL) tablet 600 mg  Once        Route: Oral  Ordered Dose: 600 mg     Last MAR action: Given Johnasia Liese A            LABORATORY RESULTS    Ordered and independently interpreted AVAILABLE laboratory tests.   Results       Procedure Component Value Units Date/Time     COVID-19 (SARS-CoV-2) and Influenza A/B, NAA ([facility]) [[id number]][[id number]] Collected: 12/27/21 1929    Specimen: Culturette from Nasopharyngeal Updated: 12/27/21 2046     Purpose of COVID testing Diagnostic -[facility]I     SARS-CoV-2 Specimen Source Nasal Swab     SARS CoV 2 Overall Result Not Detected     Influenza A Not Detected     Influenza B Not Detected    Narrative:      o Collect and clearly label specimen type:  o PREFERRED-Upper respiratory specimen: One Nasal Swab in  [employer].  o Hand deliver to laboratory ASAP  Diagnostic -[facility]I              CRITICAL CARE/PROCEDURES    Procedures    DIAGNOSIS      Diagnosis:  Final diagnoses:   Fever, unspecified fever cause       Disposition:  ED Disposition       ED Disposition   Discharge    Condition   --    Date/Time   Fri Dec 27, 2021  9:03 PM    Comment   Eric [name] discharge to home/self care.    Condition at disposition: Stable                 Prescriptions:  Discharge Medication List as of 12/27/2021  9:03 PM        CONTINUE these medications which have NOT CHANGED    Details   omeprazole (PriLOSEC) 20 MG capsule Take 20 mg by mouth daily, Historical Med             This note was generated by the Epic EMR system/ Dragon speech recognition and may contain inherent errors or omissions not intended by the user. Grammatical errors, random word insertions, deletions and pronoun errors  are occasional consequences of this technology due to software limitations. Not all errors are caught or corrected. If there are questions or concerns about the content of this note or information contained within the body of this dictation they should be addressed directly with the author for clarification.      Loran Sentershernoby, Aisha Greenberger A, MD  12/27/21 2134

## 2021-12-29 ENCOUNTER — Emergency Department
Admission: EM | Admit: 2021-12-29 | Discharge: 2021-12-29 | Disposition: A | Discharge status: Home / Self Care | Payer: Self-pay | Attending: Emergency Medicine | Admitting: Emergency Medicine

## 2021-12-29 ENCOUNTER — Emergency Department: Payer: Self-pay

## 2021-12-29 DIAGNOSIS — F17210 Nicotine dependence, cigarettes, uncomplicated: Secondary | ICD-10-CM | POA: Insufficient documentation

## 2021-12-29 DIAGNOSIS — J069 Acute upper respiratory infection, unspecified: Secondary | ICD-10-CM | POA: Insufficient documentation

## 2021-12-29 DIAGNOSIS — Z20822 Contact with and (suspected) exposure to covid-19: Secondary | ICD-10-CM | POA: Insufficient documentation

## 2021-12-29 DIAGNOSIS — J101 Influenza due to other identified influenza virus with other respiratory manifestations: Secondary | ICD-10-CM | POA: Insufficient documentation

## 2021-12-29 LAB — COVID-19 (SARS-COV-2) & INFLUENZA  A/B, NAA (ROCHE LIAT)
Influenza A: DETECTED — AB
Influenza B: NOT DETECTED
SARS CoV 2 Overall Result: NOT DETECTED

## 2021-12-29 MED ORDER — ALBUTEROL SULFATE HFA 108 (90 BASE) MCG/ACT IN AERS
2.0000 | INHALATION_SPRAY | Freq: Once | RESPIRATORY_TRACT | Status: AC
Start: 2021-12-29 — End: 2021-12-29
  Administered 2021-12-29: 2 via RESPIRATORY_TRACT
  Filled 2021-12-29: qty 8.5

## 2021-12-29 MED ORDER — BENZONATATE 100 MG PO CAPS
100.0000 mg | ORAL_CAPSULE | Freq: Three times a day (TID) | ORAL | 0 refills | Status: AC | PRN
Start: 2021-12-29 — End: ?

## 2021-12-29 MED ORDER — PREDNISONE 20 MG PO TABS
40.0000 mg | ORAL_TABLET | Freq: Every day | ORAL | 0 refills | Status: AC
Start: 2021-12-29 — End: 2022-01-03

## 2021-12-29 MED ORDER — IBUPROFEN 400 MG PO TABS
400.0000 mg | ORAL_TABLET | Freq: Four times a day (QID) | ORAL | 0 refills | Status: AC | PRN
Start: 2021-12-29 — End: 2022-01-08

## 2021-12-29 MED ORDER — ACETAMINOPHEN 325 MG PO TABS
650.0000 mg | ORAL_TABLET | ORAL | 0 refills | Status: AC | PRN
Start: 2021-12-29 — End: 2022-01-08

## 2021-12-29 NOTE — ED Notes (Signed)
Patient states he does not feel any different or like the medicine worked. Advised patient he used spacer correctly and it is not an immediate affect but he should start to feel better in a few minutes. Advised patient he may feel anxious due to medication as well. Patient taken to xray by tech.

## 2021-12-29 NOTE — EDIE (Signed)
COLLECTIVE?NOTIFICATION?12/29/2021 13:13?[name], Eric A?MRN: [id number]    Criteria Met      5 ED Visits in 12 Months    Security and Safety  No Security Events were found.  ED Care Guidelines  There are currently no ED Care Guidelines for this patient. Please check your facility's medical records system.        Prescription Drug Data  No Prescription Drug Data was found.    E.D. Visit Count (12 mo.)  Facility Visits   [facility]   [facility] 5   [place] [facility] 2   [facility] 1   Total 9   Note: Visits indicate total known visits.     Recent Emergency Department Visit Summary  Date Facility [facility] Type Diagnoses or Chief Complaint    Dec 29, 2021  [place] [facility]: [facility]  [place]  Emergency      Flu like symptoms      Covid-19 Screening      Dec 27, 2021  [place] [facility]: [facility].  [place]  Emergency      Cough, Weakness      Generalized Body Aches      Cough      Fever, unspecified      Oct 20, 2021  [facility]  [place]  [place]  Emergency      Gastritis, unspecified, without bleeding      Sep 28, 2021  [facility]  [place]  [place]  Emergency      Epigastric pain      Aug 31, 2021  [facility].  [place]  [place]  Emergency      Cough, unspecified      May 22, 2021  [facility]  [place]  [place]  Emergency      Other fatigue      Weakness      Contact with and (suspected) exposure to COVID-19      May 21, 2021  [facility]  [place]  [place]  Emergency      VOMITING, SHARP PAIN FROM NECK TO CHEST AND FATIGE      Headache      Abdominal Pain      Vomiting      Nausea      Chest Pain      Weakness - Generalized      Mar 01, 2021  [facility]  [place]  [place]  Emergency      Residual hemorrhoidal skin tags      Feb 05, 2021  [facility]  [place]    Emergency      Unspecified  abdominal pain      Nicotine dependence, unspecified, uncomplicated        Recent Inpatient Visit Summary  No Recent Inpatient Visits were found.  Care Team  No Care Team was found.  Collective Portal  This patient has registered at the [facility] Emergency Room: [facility] Emergency Department   For more information visit: [email or web address]     PLEASE NOTE:     1.   Any care recommendations and other clinical information are provided as guidelines or for historical purposes only, and providers should exercise their own clinical judgment when providing care.    2.   You may  only use this information for purposes of treatment, payment or health care operations activities, and subject to the limitations of applicable Collective Policies.    3.   You should consult directly with the organization that provided a care guideline or other clinical history with any questions about additional information or accuracy or completeness of information provided.    ? 2023 [employer] - [email or web address]

## 2021-12-29 NOTE — ED Notes (Signed)
NAD at time of discharge, patient independent to exit

## 2021-12-29 NOTE — EDIE (Signed)
COLLECTIVE?NOTIFICATION?12/29/2021 13:13?[name], Eric A?MRN: [id number]    Criteria Met      5 ED Visits in 12 Months    Security and Safety  No Security Events were found.  ED Care Guidelines  There are currently no ED Care Guidelines for this patient. Please check your facility's medical records system.        Prescription Drug Data  No Prescription Drug Data was found.    E.D. Visit Count (12 mo.)  Facility Visits   [facility] 1   [facility] 5   [place] [facility]: [facility] 2   [facility] 1   Total 9   Note: Visits indicate total known visits.     Recent Emergency Department Visit Summary  Date Facility [facility] Type Diagnoses or Chief Complaint    Dec 29, 2021  [place] [facility]: [facility].  [place]  Emergency      Fever, cough      Dec 27, 2021  [place] [facility]: [facility]  [place]  Emergency      Cough, Weakness      Generalized Body Aches      Cough      Fever, unspecified      Oct 20, 2021  [facility]  [place]  [place]  Emergency      Gastritis, unspecified, without bleeding      Sep 28, 2021  [facility]  [place]  [place]  Emergency      Epigastric pain      Aug 31, 2021  [facility]  [place]  [place]  Emergency      Cough, unspecified      May 22, 2021  [facility].  [place]  [place]  Emergency      Other fatigue      Weakness      Contact with and (suspected) exposure to COVID-19      May 21, 2021  [facility]  [place]  [place]  Emergency      VOMITING, SHARP PAIN FROM NECK TO CHEST AND FATIGE      Headache      Abdominal Pain      Vomiting      Nausea      Chest Pain      Weakness - Generalized      Mar 01, 2021  [facility].  [place]  [place]  Emergency      Residual hemorrhoidal skin tags      Feb 05, 2021  [facility]  [place]    Emergency      Unspecified abdominal pain      Nicotine  dependence, unspecified, uncomplicated        Recent Inpatient Visit Summary  No Recent Inpatient Visits were found.  Care Team  No Care Team was found.  Collective Portal  This patient has registered at the [facility] Emergency Room: [facility] Emergency Department   For more information visit: [email or web address]     PLEASE NOTE:     1.   Any care recommendations and other clinical information are provided as guidelines or for historical purposes only, and providers should exercise their own clinical judgment when providing care.    2.   You may only use this information for purposes of treatment,  payment or health care operations activities, and subject to the limitations of applicable Collective Policies.    3.   You should consult directly with the organization that provided a care guideline or other clinical history with any questions about additional information or accuracy or completeness of information provided.    ? 2023 [employer] - [email or web address]

## 2021-12-29 NOTE — EDIE (Signed)
COLLECTIVE?NOTIFICATION?12/29/2021 13:31?Brule, Froylan A?MRN: [id number]    Criteria Met      5 ED Visits in 12 Months    Security and Safety  No Security Events were found.  ED Care Guidelines  There are currently no ED Care Guidelines for this patient. Please check your facility's medical records system.        Prescription Drug Data  No Prescription Drug Data was found.    E.D. Visit Count (12 mo.)  Facility Visits   [facility] 1   [facility] 5   [place] Emergency [facility]: [facility] 2   [facility] 1   Total 9   Note: Visits indicate total known visits.     Recent Emergency Department Visit Summary  Date Facility [facility] Type Diagnoses or Chief Complaint    Dec 29, 2021  [place] [facility]: [facility].  [place]  Emergency      Flu like symptoms      Covid-19 Screening      Dec 27, 2021  [place] [facility]: [facility].  [place]  Emergency      Cough, Weakness      Generalized Body Aches      Cough      Fever, unspecified      Oct 20, 2021  [facility]  [place]  [place]  Emergency      Gastritis, unspecified, without bleeding      Sep 28, 2021  [facility].  [place]  [place]  Emergency      Epigastric pain      Aug 31, 2021  [facility].  [place]  [place]  Emergency      Cough, unspecified      May 22, 2021  [facility].  [place]  [place]  Emergency      Other fatigue      Weakness      Contact with and (suspected) exposure to COVID-19      May 21, 2021  [facility]  [place]  [place]  Emergency      VOMITING, SHARP PAIN FROM NECK TO CHEST AND FATIGE      Headache      Abdominal Pain      Vomiting      Nausea      Chest Pain      Weakness - Generalized      Mar 01, 2021  [facility]  [place]  [place]  Emergency      Residual hemorrhoidal skin tags      Feb 05, 2021  [facility].  [place]  [place]  Emergency      Unspecified  abdominal pain      Nicotine dependence, unspecified, uncomplicated        Recent Inpatient Visit Summary  No Recent Inpatient Visits were found.  Care Team  No Care Team was found.  Collective Portal  This patient has registered at the [facility] Emergency Room: [facility] Emergency Department   For more information visit: [email or web address]     PLEASE NOTE:     1.   Any care recommendations and other clinical information are provided as guidelines or for historical purposes only, and providers should exercise their own clinical judgment when providing care.    2.   You may  only use this information for purposes of treatment, payment or health care operations activities, and subject to the limitations of applicable Collective Policies.    3.   You should consult directly with the organization that provided a care guideline or other clinical history with any questions about additional information or accuracy or completeness of information provided.    ? 2023 [employer] - [email or web address]

## 2021-12-29 NOTE — ED Provider Notes (Signed)
EMERGENCY DEPARTMENT NOTE     Patient initially seen and examined at   ED PHYSICIAN ASSIGNED       Date/Time Event User Comments    12/29/21 1340 Physician Assigned Stillmore, Eric Aly, MD assigned as Attending           ED MIDLEVEL (APP) ASSIGNED       Date/Time Event User Comments    12/29/21 1317 PA/NP Provider Assigned [name], Eric Maw, FNP assigned as Nurse Practitioner            HISTORY OF PRESENT ILLNESS       Chief Complaint: Covid-19 Screening and Flu like symptoms       31 y.o. [name] with past medical history as below who presents with complaints of URI symptoms since Tuesday.  Chills, myalgia, congestion, and cough.  Had similar symptoms about a month or more ago.  Got better when in home country of [place] and then got symptoms again after return a week ago.  He has had a lot of ER visits in past including several days ago      MEDICAL HISTORY     Past Medical History:  Past Medical History:   Diagnosis Date    Colon abnormality     IBS (irritable bowel syndrome)        Past Surgical History:  History reviewed. No pertinent surgical history.    Social History:  Social History     Socioeconomic History    Marital status: Single   Tobacco Use    Smoking status: Every Day     Packs/day: 0.50     Types: Cigarettes    Smokeless tobacco: Never   Vaping Use    Vaping Use: Never used   Substance and Sexual Activity    Alcohol use: Not Currently     Comment: socially on weekends per pt    Drug use: Never   Social History Narrative    ** Merged History Encounter **            Family History:  History reviewed. No pertinent family history.    Outpatient Medication:  Previous Medications    OMEPRAZOLE (PRILOSEC) 20 MG CAPSULE    Take 20 mg by mouth daily         REVIEW OF SYSTEMS   Review of Systems   Constitutional:  Positive for chills and fatigue.   HENT:  Positive for congestion. Negative for sore throat.    Respiratory:  Positive for cough. Negative for shortness of  breath.    Cardiovascular:  Negative for chest pain and leg swelling.   Gastrointestinal:  Negative for vomiting.   Skin:  Negative for rash.   Neurological:  Negative for speech difficulty.   Psychiatric/Behavioral:  Negative for confusion.   See History of Present Illness  PHYSICAL EXAM     ED Triage Vitals [12/29/21 1321]   Enc Vitals Group      BP 112/56      Heart Rate 90      Resp Rate 18      Temp 98.9 F (37.2 C)      Temp Source Oral      SpO2 100 %      Weight 70 kg      Height 1.829 m      Head Circumference       Peak Flow       Pain Score 6  Pain Loc       Pain Edu?       Excl. in GC?      Physical Exam   Nursing note and vitals reviewed.  Constitutional:  Well developed, well nourished. Awake & Oriented x3.  Head:  Atraumatic. Normocephalic.    Eyes:  PERRL. EOMI. Conjunctivae are not pale.  ENT:  Mucous membranes are moist and intact. Oropharynx is clear and symmetric.  Patent airway.  Neck:  Supple. Full ROM.    Cardiovascular:  Regular rate. Regular rhythm. No murmurs, rubs, or gallops.  Pulmonary/Chest:  No evidence of respiratory distress. Clear to auscultation bilaterally.  No wheezing, rales or rhonchi.   Abdominal:  Soft and non-distended. There is no tenderness. No rebound, guarding, or rigidity.  Back:  Full ROM. Nontender.  Extremities:  No edema. No cyanosis. No clubbing. Full range of motion in all extremities.  Skin:  Skin is warm and dry.  No diaphoresis. No rash.   Neurological:  Alert, awake, and appropriate. Normal speech. Motor normal.  Psychiatric:  Good eye contact. Normal interaction, affect, and behavior.  Pox 100 ra normal no tx needed  MEDICAL DECISION MAKING     PRIMARY PROBLEM LIST      Acute illness/injury DIAGNOSIS:  Cough     Differential Diagnosis: URI (adult): pleuritis, URI, pneumonia, viral syndrome, asthma/COPD exacerbation, respiratory failure, sepsis, pulmonary edema, myocarditis, COVID19   DISCUSSION    PT in no distress  Asks for chest x-ray  Labs  noted          External Records Reviewed?: Other (explain)  I reviewed other NON [facility] ER records to help understand reason for many ED visits.     Additional Notes    Diagnostic test considered and not performed: Other (please explain)  considered malaria test though not suspicious  Prescription medications considered and not given: Other (please explain) Tamiflu but out of 72 hour window    Social Determinants of Health Considerations: Yes (explain)  Does not appear to have insurance which may limit care with PCP.             Vital Signs: Reviewed the patient's vital signs.   Nursing Notes: Reviewed and utilized available nursing notes.  Medical Records Reviewed: Reviewed available past medical records.  Counseling: The emergency provider has spoken with the patient and discussed today's findings, in addition to providing specific details for the plan of care.  Questions are answered and there is agreement with the plan.      EMERGENCY IMAGING STUDIES    The following imagine studies were independently interpreted by me (emergency medicine provider):    Chest Xray Interpreted by me (ED provider)   Comparison: Yes.  No acute changes.  DATE:Eric/27/21  RESULT: No infiltrate. No pneumothorax. No large hemothorax. No CHF.  IMPRESSION: No acute abnormality            RADIOLOGY IMAGING STUDIES      XR Chest 2 Views   Final Result    No acute pulmonary infiltrates are demonstrated.       Merri Ray, MD    12/29/2021 2:01 PM          EMERGENCY DEPT. MEDICATIONS      ED Medication Orders (From admission, onward)      Start Ordered     Status Ordering Provider    12/29/21 1351 12/29/21 1350  albuterol sulfate HFA (PROVENTIL) inhaler 2 puff  RT - Once  Note to Pharmacy: Give to go to use 2 puffs every 4 hour and instruct how to use.  Dispense as emergency   Route: Inhalation  Ordered Dose: 2 puff     Last MAR action: Given Alexea Blase C III            LABORATORY RESULTS    Ordered and independently interpreted  AVAILABLE laboratory tests.   Results       Procedure Component Value Units Date/Time    COVID-19 (SARS-CoV-2) and Influenza A/B, NAA ([facility]) [[id number]]  (Abnormal) Collected: 12/29/21 1312    Specimen: Culturette from Nasopharyngeal Updated: 12/29/21 1400     Purpose of COVID testing Diagnostic -[facility]     SARS-CoV-2 Specimen Source Nasal Swab     SARS CoV 2 Overall Result Not Detected     Influenza A Detected     Influenza B Not Detected    Narrative:      o Collect and clearly label specimen type:  o PREFERRED-Upper respiratory specimen: One Nasal Swab in  Transport Media.  o Hand deliver to laboratory ASAP  Diagnostic -[facility]              CRITICAL CARE/PROCEDURES    Procedures  DIAGNOSIS      Diagnosis:  Final diagnoses:   Influenza A   COVID-19 ruled out by laboratory testing   Viral upper respiratory tract infection       Disposition:  ED Disposition       ED Disposition   Discharge    Condition   --    Date/Time   Sun Dec 29, 2021  2:Eric PM    Comment   Voyd Abdelrahin I Knipp discharge to home/self care.    Condition at disposition: Stable                 Prescriptions:  Patient's Medications   New Prescriptions    ACETAMINOPHEN (TYLENOL) 325 MG TABLET    Take 2 tablets (650 mg) by mouth every 4 (four) hours as needed for Pain    BENZONATATE (TESSALON) 100 MG CAPSULE    Take 1 capsule (100 mg) by mouth 3 (three) times daily as needed for Cough    IBUPROFEN (ADVIL) 400 MG TABLET    Take 1 tablet (400 mg) by mouth every 6 (six) hours as needed for Pain    PREDNISONE (DELTASONE) 20 MG TABLET    Take 2 tablets (40 mg) by mouth daily for 5 days   Previous Medications    OMEPRAZOLE (PRILOSEC) 20 MG CAPSULE    Take 20 mg by mouth daily   Modified Medications    No medications on file   Discontinued Medications    No medications on file           This note was generated by the Epic EMR system/ Dragon speech recognition and may contain inherent errors or omissions not intended by the user. Grammatical errors, random  word insertions, deletions and pronoun errors  are occasional consequences of this technology due to software limitations. Not all errors are caught or corrected. If there are questions or concerns about the content of this note or information contained within the body of this dictation they should be addressed directly with the author for clarification.       Bradly Chris, MD  12/29/21 1414

## 2022-04-04 NOTE — Progress Notes (Signed)
Formatting of this note is different from the original.  [facility] Out Patient Initial Visit    Reason for consultation:    HPI: Gavin Conner is a 31 y.o. year old male who [facility] for evaluation of rectal pain.  He states he has had episodes of rectal pain and bleeding for the past 2 years.  Over the past 6 months these episodes have worsened, with the last month being the worst so far.  His pain occurs with bowel movements and lasts 10-12 hours afterwards, which makes it very hard for him to work or drive.  Does not improve with ibuprofen or the use of topical creams, but sometimes has relief if he puts a finger in his anus.  His most recent episode of bleeding was 2-3 weeks ago, which he describes as "a lot."  States he has bowel movements every other day that are sometimes hard.  Has never had a colonoscopy.  States he has had some abdominal upset lately that he has been to the ED for several times.      Past Medical History:   History reviewed. No pertinent past medical history.     Past Surgical History:   History reviewed. No pertinent surgical history.     Social History:   Social History     Tobacco Use   ? Smoking status: Every Day     Packs/day: 0.50     Types: Cigarettes   ? Smokeless tobacco: Never   Vaping Use   ? Vaping Use: Never used   Substance Use Topics   ? Alcohol use: Yes     Comment: occassionally   ? Drug use: Never       Family History:   Family History   Problem Relation Name Age of Onset   ? Diabetes Mother     ? Hypertension Mother         Review of Systems:   All other systems negative except as per HPI.    Allergies:   No Known Allergies     Medications:  Current Outpatient Medications on File Prior to Visit   Medication Sig Dispense Refill   ? hydrocortisone 1 % cream Apply topically 2 times daily.     ? ketoROLAC (Toradol) 10 MG tablet Take 10 mg by mouth every 6 hours as needed.       No current facility-administered medications on file prior to  visit.     Physical Examination:  Visit Vitals  BP 107/75 (BP Location: Left arm)   Pulse 78   Temp 36.4 C (97.5 F) (Oral)   Resp 18   Ht 1.829 m   Wt 70 kg   SpO2 100%   BMI 20.92 kg/m   Smoking Status Every Day   BSA 1.89 m     General:  Alert and oriented, No acute distress.    Eye:  Extraocular movements are intact.    HEENT:  Normocephalic, atraumatic.  Respiratory:  Respirations are non-labored, Breath sounds are equal.    Cardiovascular:  Regular rate, Normal rhythm.    Gastrointestinal:  Soft, non-tender, non-distended  Musculoskeletal:  Normal strength.    Integumentary:  Warm, Dry, Intact.    Neurologic:  Alert, Oriented, No focal defects.    Psychiatric:  Cooperative, Appropriate mood & affect, Normal judgment.       Anorectal:  Normal external exam.  Could not tolerate internal exam.  Results and Data:     External  Source of Record Review: NA     Test(s) Reviewed: NA    Independent Test Interpretation: NA    Assessment   Gavin Conner is a 31 y.o. year old male who presents [facility] for evaluation of rectal pain.    Plan   1.  Plan for colonoscopy to evaluate rectal bleeding  2.  Recommend increased fiber and water intake.  Use stool softeners as needed to prevent constipation.  Avoid straining.    A total of 60 minutes was spent in face-to-face communication and consultation, coordinating the care for this patient.     Tests and Medications:   Test(s) ordered: Colonoscopy    Medication(s) ordered: NA    Discussion of Management and Test(s)-     Celesta Gentile. Lambert Mody, MD FACS  Assistant Professor of Surgery  Division of Colon and Rectal Surgery  [facility]   Electronically signed by Hulen Luster, MD at 04/04/2022  9:50 AM EDT    Associated attestation - Hulen Luster, MD - 04/04/2022  9:50 AM EDT  Formatting of this note might be different from the original.  Attending attestation: I was present with the resident during the interview & examination of  the patient. I personally repeated the critical or key portions of the exam in a face to face encounter with the patient and discussed the patient's management with the resident. I reviewed the resident's note, edited as needed and confirm the documented findings and plan of care.      The patient has very poor health literacy and understanding.  I had explained to him multiple times through the [facility] interpreter what fiber was.  Because of his rectal bleeding I have recommended a diagnostic colonoscopy.  I explained to him multiple times that at this time I am unable to identify a cause of his rectal pain.  He does not have a thrombosed external hemorrhoid or perianal abscess.  In my limited exam that he was able to tolerate, I was not able to identify a discrete perianal fissure.  The patient's questions were answered to the best of my ability.    A total of 60 minutes was spent in evaluation, communication and consultation, coordinating the care for this patient.     Stephen P. Lambert Mody, MD FACS  Assistant Professor of Surgery  Division of Colon and Rectal Surgery  [facility]

## 2022-08-15 DIAGNOSIS — J029 Acute pharyngitis, unspecified: Secondary | ICD-10-CM

## 2022-08-15 NOTE — ED Triage Notes (Signed)
[facility] Emergency Room Nursing Note        Patient Name: Gavin Conner      DOB: 12-07-1990             MRN: [id number]      Chief Complaint:  Pharyngitis and Chills      Admit Diagnosis: No admission diagnoses are documented for this encounter.      Admitting Provider: No admitting provider for patient encounter.      Surgery: * No surgery found *           Patient arrived to the ER ambulatory from home with complaints of a Sore Throat, Chills & Body Aches that started 2 days ago. Per patient, he went to [facility] ER and tested negative for Flu & COVID.         Lines:        Signed by: Nolen Mu, RN, MBA, BSN, [facility]                                              08/15/2022 at 11:30 PM

## 2022-08-16 ENCOUNTER — Inpatient Hospital Stay
Admit: 2022-08-16 | Discharge: 2022-08-16 | Disposition: A | Discharge status: Home / Self Care | Payer: MEDICAID | Attending: Student in an Organized Health Care Education/Training Program

## 2022-08-16 LAB — RAPID STREP SCREEN: Strep A Ag: NEGATIVE

## 2022-08-16 MED ORDER — IBUPROFEN 600 MG PO TABS
600 MG | ORAL | Status: AC
Start: 2022-08-16 — End: 2022-08-16
  Administered 2022-08-16: 04:00:00 600 mg via ORAL

## 2022-08-16 MED ORDER — DEXAMETHASONE SOD PHOSPHATE PF 10 MG/ML IJ SOLN
10 MG/ML | Freq: Once | INTRAMUSCULAR | Status: AC
Start: 2022-08-16 — End: 2022-08-16
  Administered 2022-08-16: 04:00:00 10 mg via ORAL

## 2022-08-16 MED ORDER — ACETAMINOPHEN 500 MG PO TABS
500 MG | ORAL | Status: AC
Start: 2022-08-16 — End: 2022-08-16
  Administered 2022-08-16: 04:00:00 1000 mg via ORAL

## 2022-08-16 MED FILL — ACETAMINOPHEN EXTRA STRENGTH 500 MG PO TABS: 500 MG | ORAL | Qty: 2

## 2022-08-16 MED FILL — DEXAMETHASONE SOD PHOSPHATE PF 10 MG/ML IJ SOLN: 10 MG/ML | INTRAMUSCULAR | Qty: 1

## 2022-08-16 MED FILL — IBUPROFEN 600 MG PO TABS: 600 MG | ORAL | Qty: 1

## 2022-08-16 NOTE — ED Notes (Signed)
D/C per orders, ambulated from ED without difficulty.       Louie Casa, RN  08/16/22 [phone]

## 2022-08-18 LAB — CULTURE, THROAT: Culture: NORMAL

## 2022-08-22 ENCOUNTER — Inpatient Hospital Stay
Admit: 2022-08-22 | Discharge: 2022-08-22 | Payer: MEDICAID | Attending: Student in an Organized Health Care Education/Training Program

## 2022-08-22 ENCOUNTER — Emergency Department: Payer: MEDICAID

## 2022-08-22 ENCOUNTER — Emergency Department: Admit: 2022-08-22 | Payer: MEDICAID

## 2022-08-22 DIAGNOSIS — R1012 Left upper quadrant pain: Secondary | ICD-10-CM

## 2022-08-22 DIAGNOSIS — R1032 Left lower quadrant pain: Secondary | ICD-10-CM

## 2022-08-22 LAB — COMPREHENSIVE METABOLIC PANEL
ALT: 44 U/L (ref 12–78)
AST: 25 U/L (ref 15–37)
Albumin/Globulin Ratio: 0.7 — ABNORMAL LOW (ref 1.1–2.2)
Albumin: 3.4 g/dL — ABNORMAL LOW (ref 3.5–5.0)
Alk Phosphatase: 70 U/L (ref 45–117)
Anion Gap: 10 mmol/L (ref 5–15)
BUN/Creatinine Ratio: 10 — ABNORMAL LOW (ref 12–20)
BUN: 10 MG/DL (ref 6–20)
CO2: 27 mmol/L (ref 21–32)
Calcium: 9.4 MG/DL (ref 8.5–10.1)
Chloride: 100 mmol/L (ref 97–108)
Creatinine: 1.05 MG/DL (ref 0.70–1.30)
Est, Glom Filt Rate: >60 mL/min/{1.73_m2} (ref >60)
Globulin: 5.1 g/dL — ABNORMAL HIGH (ref 2.0–4.0)
Glucose: 95 mg/dL (ref 65–100)
Potassium: 4.3 mmol/L (ref 3.5–5.1)
Sodium: 137 mmol/L (ref 136–145)
Total Bilirubin: 0.4 MG/DL (ref 0.2–1.0)
Total Protein: 8.5 g/dL — ABNORMAL HIGH (ref 6.4–8.2)

## 2022-08-22 LAB — CBC WITH AUTO DIFFERENTIAL
Basophils %: 0 % (ref 0–1)
Basophils Absolute: 0 10*3/uL (ref 0.0–0.1)
Eosinophils %: 1 % (ref 0–7)
Eosinophils Absolute: 0.1 10*3/uL (ref 0.0–0.4)
Hematocrit: 39.1 % (ref 36.6–50.3)
Hemoglobin: 11.7 g/dL — ABNORMAL LOW (ref 12.1–17.0)
Immature Granulocytes Absolute: 0.1 10*3/uL — ABNORMAL HIGH (ref 0.00–0.04)
Immature Granulocytes: 1 % — ABNORMAL HIGH (ref 0.0–0.5)
Lymphocytes %: 26 % (ref 12–49)
Lymphocytes Absolute: 2.6 10*3/uL (ref 0.8–3.5)
MCH: 20.2 PG — ABNORMAL LOW (ref 26.0–34.0)
MCHC: 29.9 g/dL — ABNORMAL LOW (ref 30.0–36.5)
MCV: 67.6 FL — ABNORMAL LOW (ref 80.0–99.0)
MPV: 10.5 FL (ref 8.9–12.9)
Monocytes %: 8 % (ref 5–13)
Monocytes Absolute: 0.8 10*3/uL (ref 0.0–1.0)
Neutrophils %: 64 % (ref 32–75)
Neutrophils Absolute: 6.4 10*3/uL (ref 1.8–8.0)
Nucleated RBCs: 0 PER 100 WBC
Platelets: 401 10*3/uL — ABNORMAL HIGH (ref 150–400)
RBC: 5.78 M/uL — ABNORMAL HIGH (ref 4.10–5.70)
RDW: 17.3 % — ABNORMAL HIGH (ref 11.5–14.5)
WBC: 10 10*3/uL (ref 4.1–11.1)
nRBC: 0 10*3/uL (ref 0.00–0.01)

## 2022-08-22 LAB — LIPASE: Lipase: 37 U/L (ref 13–75)

## 2022-08-22 LAB — MAGNESIUM: Magnesium: 1.8 mg/dL (ref 1.6–2.4)

## 2022-08-22 MED ORDER — KETOROLAC TROMETHAMINE 30 MG/ML IJ SOLN
30 MG/ML | Freq: Once | INTRAMUSCULAR | Status: DC
Start: 2022-08-22 — End: 2022-08-22

## 2022-08-22 MED ORDER — IOPAMIDOL 76 % IV SOLN
76 % | Freq: Once | INTRAVENOUS | Status: DC | PRN
Start: 2022-08-22 — End: 2022-08-22

## 2022-08-22 MED FILL — ISOVUE-370 76 % IV SOLN: 76 % | INTRAVENOUS | Qty: 100

## 2022-08-22 NOTE — ED Provider Notes (Signed)
[facility]  EMERGENCY DEPARTMENT ENCOUNTER      Pt Name: Gavin Conner  MRN: [id number]  [place] 1991/06/13  Date of evaluation: 08/22/2022  Provider: Roena Malady, PA-C    CHIEF COMPLAINT       Chief Complaint   Patient presents with    Rib Pain         HISTORY OF PRESENT ILLNESS   (Location/Symptom, Timing/Onset, Context/Setting, Quality, Duration, Modifying Factors, Severity)  Note limiting factors.   Gavin Conner is a 31 y.o. male who presents to the emergency department with left sided abdominal pain beginning yesterday. Denies injury. Reports pain is worse with deep breathing .  Denies chest pain, shortness of breath.  Denies fever or chills.  States the pain is sharp and cramping in a pinpoint area in the left side of the abdomen.  Patient reports history of "problems with his pancreas".  Patient was recently seen by gastroenterology.  Patient denies any nausea, vomiting, diarrhea, constipation.  Patient reports normal bowel movements and has been passing gas normally.  Patient denies any other medical concerns at this time.  Patient with medical history significant for acid reflux and previous hemorrhoid surgery.      The history is provided by the patient. No language interpreter was used.         Review of External Medical Records:     Nursing Notes were reviewed.    REVIEW OF SYSTEMS    (2-9 systems for level 4, 10 or more for level 5)     Review of Systems   Gastrointestinal:  Positive for abdominal pain.       Except as noted above the remainder of the review of systems was reviewed and negative.       PAST MEDICAL HISTORY     Past Medical History:   Diagnosis Date    Acid reflux          SURGICAL HISTORY       Past Surgical History:   Procedure Laterality Date    HEMORRHOID SURGERY           CURRENT MEDICATIONS       Discharge Medication List as of 08/22/2022  5:22 PM        CONTINUE these medications which have NOT CHANGED    Details   omeprazole (PRILOSEC) 20 MG delayed release capsule  Take 1 capsule by mouth dailyHistorical Med      ondansetron (ZOFRAN-ODT) 4 MG disintegrating tablet Take 1 tablet by mouth every 8 hours as neededHistorical Med             ALLERGIES     Patient has no known allergies.    FAMILY HISTORY     History reviewed. No pertinent family history.       SOCIAL HISTORY       Social History     Socioeconomic History    Marital status: Single     Spouse name: None    Number of children: None    Years of education: None    Highest education level: None   Tobacco Use    Smoking status: Every Day     Packs/day: .5     Types: Cigarettes    Smokeless tobacco: Never   Substance and Sexual Activity    Alcohol use: Yes     Alcohol/week: 2.0 standard drinks of alcohol     Types: 2 Standard drinks or equivalent per week    Drug use: Not Currently  PHYSICAL EXAM    (up to 7 for level 4, 8 or more for level 5)     ED Triage Vitals [08/22/22 1455]   BP Temp Temp Source Pulse Respirations SpO2 Height Weight - Scale   110/85 98.4 F (36.9 C) Oral 93 16 100 % 6' (1.829 m) 149 lb 11.1 oz (67.9 kg)       Body mass index is 20.3 kg/m.    Physical Exam  Constitutional:       General: He is not in acute distress.     Appearance: Normal appearance.   HENT:      Head: Normocephalic.      Mouth/Throat:      Mouth: Mucous membranes are moist.   Eyes:      Conjunctiva/sclera: Conjunctivae normal.   Cardiovascular:      Rate and Rhythm: Normal rate.      Heart sounds: Normal heart sounds.   Pulmonary:      Effort: Pulmonary effort is normal. No respiratory distress.      Breath sounds: Normal breath sounds.   Abdominal:      General: There is no distension.      Tenderness: There is abdominal tenderness in the left upper quadrant and left lower quadrant.   Musculoskeletal:         General: Normal range of motion.   Skin:     General: Skin is dry.   Neurological:      Mental Status: He is oriented to person, place, and time.         DIAGNOSTIC RESULTS     EKG: All EKG's are interpreted by the  Emergency Department Physician who either signs or Co-signs this chart in the absence of a cardiologist.        RADIOLOGY:   Non-plain film images such as CT, Ultrasound and MRI are read by the radiologist. Plain radiographic images are visualized and preliminarily interpreted by the emergency physician with the below findings:        Interpretation per the Radiologist below, if available at the time of this note:    XR CHEST (2 VW)   Final Result      No acute cardiopulmonary process seen              LABS:  Labs Reviewed   CBC WITH AUTO DIFFERENTIAL - Abnormal; Notable for the following components:       Result Value    RBC 5.78 (*)     Hemoglobin 11.7 (*)     MCV 67.6 (*)     MCH 20.2 (*)     MCHC 29.9 (*)     RDW 17.3 (*)     Platelets 401 (*)     Immature Granulocytes 1 (*)     Absolute Immature Granulocyte 0.1 (*)     All other components within normal limits   COMPREHENSIVE METABOLIC PANEL - Abnormal; Notable for the following components:    Bun/Cre Ratio 10 (*)     Total Protein 8.5 (*)     Albumin 3.4 (*)     Globulin 5.1 (*)     Albumin/Globulin Ratio 0.7 (*)     All other components within normal limits   MAGNESIUM   LIPASE       All other labs were within normal range or not returned as of this dictation.    EMERGENCY DEPARTMENT COURSE and DIFFERENTIAL DIAGNOSIS/MDM:   Vitals:    Vitals:    08/22/22 1455  BP: 110/85   Pulse: 93   Resp: 16   Temp: 98.4 F (36.9 C)   TempSrc: Oral   SpO2: 100%   Weight: 67.9 kg (149 lb 11.1 oz)   Height: 1.829 m (6')           Medical Decision Making  This is a 31 year old male presents emergency department with left-sided abdominal pain.  Plan to get CBC, CMP, magnesium, lipase and a CT abdomen pelvis with IV contrast.  Will medicate patient's pain with Toradol.    Patient eloped from the waiting room prior to lab work results and CT scan.  Patient's IV was removed prior to elopement.    Amount and/or Complexity of Data Reviewed  Labs: ordered. Decision-making details  documented in ED Course.  Radiology: ordered.    Risk  Prescription drug management.            REASSESSMENT     ED Course as of 08/22/22 2003   Fri Aug 22, 2022   1655 CBC with Auto Differential(!):    WBC 10.0   RBC 5.78(!)   Hemoglobin Quant 11.7(!)   Hematocrit 39.1   MCV 67.6(!)   MCH 20.2(!)   MCHC 29.9(!)   RDW 17.3(!)   Platelet Count 401(!)   MPV 10.5   Nucleated Red Blood Cells 0.0   Nucleated Red Blood Cells 0.00   Neutrophils % 64   Lymphocyte % 26   Monocytes % 8   Eosinophils % 1   Basophils % 0   Immature Granulocytes 1(!)   Neutrophils Absolute 6.4   Lymphocytes Absolute 2.6   Monocytes Absolute 0.8   Eosinophils Absolute 0.1   Basophils Absolute 0.0   Absolute Immature Granulocyte 0.1(!)   Differential Type SMEAR SCANNED   RBC Comment MICROCYTOSIS  1+    Anemia  [TR]   1720 CMP(!):    Sodium 137   Potassium 4.3   Chloride 100   CO2 27   Anion Gap 10   Glucose, Random 95   BUN,BUNPL 10   Creatinine 1.05   Bun/Cre Ratio 10(!)   Est, Glom Filt Rate >60   CALCIUM, SERUM, 500694 9.4   BILIRUBIN TOTAL 0.4   ALT 44   AST 25   Alk Phos 70   Total Protein 8.5(!)   Albumin 3.4(!)   Globulin 5.1(!)   ALBUMIN/GLOBULIN RATIO 0.7(!)  Normal  [TR]   1720 Lipase:    Lipase 37  Normal  [TR]   1720 Magnesium:    Magnesium 1.8  Normal  [TR]      ED Course User Index  [TR] Roena Malady, PA-C           CONSULTS:  None    PROCEDURES:  Unless otherwise noted below, none     Procedures      FINAL IMPRESSION      1. Left lower quadrant abdominal pain    2. Left upper quadrant abdominal pain          DISPOSITION/PLAN   DISPOSITION Eloped - Left Before Treatment Complete 08/22/2022 05:22:30 PM      PATIENT REFERRED TO:  No follow-up provider specified.    DISCHARGE MEDICATIONS:  Discharge Medication List as of 08/22/2022  5:22 PM            (Please note that portions of this note were completed with a voice recognition program.  Efforts were made to edit the dictations but occasionally words are mis-transcribed.)  Roena Malady, PA-C (electronically signed)  Emergency Attending Physician / Physician Assistant / Nurse Practitioner             Roena Malady, PA-C  08/22/22 2003

## 2022-08-22 NOTE — ED Triage Notes (Addendum)
Pt reports L rib pain since yesterday. Pt reports the pain is worse when he takes a deep breath or when he talks. Pt denies injury or fall. Pt offered interpreter multiple times during triage and patient refused.

## 2022-08-22 NOTE — ED Notes (Signed)
Patient states he would like to leave. Patient states "I do not want to wait hours for a room". Marely Apgar, RN informed patient that there is no room currently available. Patient states he does not want to wait and would like to leave. Latron Ribas,, RN removed patient's IV and he left the ED from the waiting room. Baxter Flattery, [place] notified.      Isaac Bliss, RN  08/22/22 1722

## 2022-09-06 ENCOUNTER — Inpatient Hospital Stay
Admit: 2022-09-06 | Discharge: 2022-09-06 | Disposition: A | Discharge status: Home / Self Care | Payer: MEDICAID | Attending: Emergency Medicine

## 2022-09-06 DIAGNOSIS — Z5329 Procedure and treatment not carried out because of patient's decision for other reasons: Secondary | ICD-10-CM

## 2022-09-06 MED ORDER — ONDANSETRON 4 MG PO TBDP
4 MG | ORAL | Status: DC
Start: 2022-09-06 — End: 2022-09-06

## 2022-09-06 MED ORDER — LIDOCAINE VISCOUS HCL 2 % MT SOLN
2 % | Freq: Once | OROMUCOSAL | Status: DC
Start: 2022-09-06 — End: 2022-09-06

## 2022-09-06 NOTE — ED Triage Notes (Signed)
Pt c/o nausea and GERD after drinking too much alcohol last night. Pt states that last time this happened, the ER gave him a GI cocktail to help his symptoms and it worked. No other complaints.

## 2022-09-06 NOTE — ED Notes (Signed)
Pt NA to be roomed. Not found in triage. MD aware     Ezekiel Ina, RN  09/06/22 [phone]

## 2023-07-28 ENCOUNTER — Encounter: Payer: Self-pay | Admitting: Physician Assistant

## 2023-07-28 ENCOUNTER — Emergency Department
Admission: EM | Admit: 2023-07-28 | Discharge: 2023-07-28 | Disposition: A | Discharge status: Home / Self Care | Payer: No Typology Code available for payment source | Attending: Emergency Medicine | Admitting: Emergency Medicine

## 2023-07-28 DIAGNOSIS — F17210 Nicotine dependence, cigarettes, uncomplicated: Secondary | ICD-10-CM | POA: Insufficient documentation

## 2023-07-28 DIAGNOSIS — K219 Gastro-esophageal reflux disease without esophagitis: Secondary | ICD-10-CM | POA: Insufficient documentation

## 2023-07-28 MED ORDER — NYSTATIN 100000 UNIT/ML MT SUSP
10.0000 mL | Freq: Two times a day (BID) | ORAL | 0 refills | Status: DC | PRN
Start: 2023-07-28 — End: 2023-07-28

## 2023-07-28 MED ORDER — FAMOTIDINE 20 MG PO TABS
20.0000 mg | ORAL_TABLET | Freq: Two times a day (BID) | ORAL | 0 refills | Status: DC
Start: 2023-07-28 — End: 2023-07-28

## 2023-07-28 MED ORDER — LIDOCAINE VISCOUS HCL 2 % MT SOLN
10.0000 mL | Freq: Once | OROMUCOSAL | Status: AC
Start: 2023-07-28 — End: 2023-07-28
  Administered 2023-07-28: 10 mL via OROMUCOSAL
  Filled 2023-07-28: qty 15

## 2023-07-28 MED ORDER — ALUM & MAG HYDROXIDE-SIMETH 200-200-20 MG/5ML PO SUSP
30.0000 mL | Freq: Once | ORAL | Status: AC
Start: 2023-07-28 — End: 2023-07-28
  Administered 2023-07-28: 30 mL via ORAL
  Filled 2023-07-28: qty 30

## 2023-07-28 MED ORDER — FAMOTIDINE 20 MG PO TABS
20.0000 mg | ORAL_TABLET | Freq: Once | ORAL | Status: AC
Start: 2023-07-28 — End: 2023-07-28
  Administered 2023-07-28: 20 mg via ORAL
  Filled 2023-07-28: qty 1

## 2023-07-28 MED ORDER — NYSTATIN 100000 UNIT/ML MT SUSP
10.0000 mL | Freq: Two times a day (BID) | ORAL | 0 refills | Status: AC | PRN
Start: 2023-07-28 — End: 2023-08-02

## 2023-07-28 MED ORDER — FAMOTIDINE 20 MG PO TABS
20.0000 mg | ORAL_TABLET | Freq: Two times a day (BID) | ORAL | 0 refills | Status: AC
Start: 2023-07-28 — End: 2023-08-07

## 2023-07-28 NOTE — ED Provider Notes (Signed)
EMERGENCY DEPARTMENT NOTE     Patient initially seen and examined at   ED PHYSICIAN ASSIGNED       None           ED MIDLEVEL (APP) ASSIGNED       Date/Time Event User Comments    07/28/23 0854 PA/NP Provider Assigned Eymi Lipuma, Eulis Foster, PA assigned as Physician Assistant            HISTORY OF PRESENT ILLNESS   Historian:Patient  Translator Used: No    Chief Complaint: Epigastric pain     Mechanism of Injury:       32 y.o. male with past medical history as stated below, comes in today for complaint of burning in esophagus, epigastric and throat since last night after eating pizza and wings.  Pt reports a history of same over the last 1 year.  Pt has not taken meds over the counter or otherwise. Pain rated at 10/10, described as burning from epigastric region, radiating up substernal chest.  Pt at times feels burning in his throat and coughs and burps quite a bit.     Pt has not been seen by a Primary care doctor or GI Specialist for this complaint.     Pt has no hx of abdominal pain, abdominal surgeries. No family hx of GI conditions.   Pt admits to drinking ETOH most weekends and smoking which generally make symptoms worse.     Pt denies any associated nausea, vomiting, fevers, chills, chest pain or pressure, difficulty breathing, shortness of breath, blood in stools, black stools, abdominal pain, unexplained weight loss, back pain or any other associated complaints.    Social history stated below.    Location of symptoms: Epigastric  Onset of symptoms: Last night  What was patient doing when symptoms started (Context): see above  Severity: Severe  Timing: Persistent  Activities that worsen symptoms: Lying flat and after eating big meal  Activities that improve symptoms: Sitting up  Quality: Burning  Radiation of symptoms: no  Associated signs and Symptoms: see above  Are symptoms worsening? yes  MEDICAL HISTORY     Past Medical History:  Past Medical History:   Diagnosis Date    Colon  abnormality     IBS (irritable bowel syndrome)        Past Surgical History:  History reviewed. No pertinent surgical history.    Social History:  Social History[1]    Family History:  History reviewed. No pertinent family history.    Outpatient Medication:  Previous Medications    BENZONATATE (TESSALON) 100 MG CAPSULE    Take 1 capsule (100 mg) by mouth 3 (three) times daily as needed for Cough    OMEPRAZOLE (PRILOSEC) 20 MG CAPSULE    Take 20 mg by mouth daily         REVIEW OF SYSTEMS   Review of Systems     See HPI      PHYSICAL EXAM     ED Triage Vitals [07/28/23 0837]   Encounter Vitals Group      BP 140/90      Systolic BP Percentile       Diastolic BP Percentile       Heart Rate 61      Resp Rate 18      Temp 97.7 F (36.5 C)      Temp src       SpO2 100 %      Weight  Height 1.88 m      Head Circumference       Peak Flow       Pain Score 10      Pain Loc       Pain Education       Exclude from Growth Chart      Physical Exam  Constitutional:       General: He is not in acute distress.  Eyes:      General: No scleral icterus.  Cardiovascular:      Rate and Rhythm: Normal rate.      Pulses: Normal pulses.   Pulmonary:      Effort: Pulmonary effort is normal.   Abdominal:      General: Abdomen is flat.      Palpations: Abdomen is soft.      Tenderness: There is abdominal tenderness (Epigastric).   Skin:     Capillary Refill: Capillary refill takes less than 2 seconds.   Neurological:      Mental Status: He is alert.      Gait: Gait normal.             MEDICAL DECISION MAKING     DISCUSSION    Assessment: Patient with complaints of epigastric discomfort described as burning after eating large meal of pizza and wings last night just before laying down to go to sleep, persistent this morning.     No other concerning constitutional signs or symptoms consistent with a peritoneal infection.  Patient has a history of this generally after smoking, drinking alcohol or eating a large meal before lying down.     No  history of acute coronary syndrome, denies cocaine use, no family history of premature cardiac events.     Exam: As stated above, vitals are stable, afebrile.  Mild epigastric discomfort to palpation, otherwise well-appearing, nontoxic, not in acute distress, no respiratory distress.      Discussed H&P, MDM and treatment plan with supervising Physician, Dr. Dossie Der who agrees with my plan.     Plan: Will trial GI cocktail and Pepcid p.o.  Reassess.    Reevaluation: Patient reports feeling much better after GI cocktail and Pepcid p.o. given.  P.o. challenge shows no persistent reflux epigastric pain or burning sensation.      Considered blood work, imaging, EKG however patient symptoms are consistent with previous symptoms of reflux and precipitated by a large meal before laying down.  Patient encouraged to follow-up with neighborhood health, establish primary care get further testing and evaluation for reflux including with H. pylori test, follow-up with gastroenterologist and get an endoscopy and colonoscopy as needed.  Patient is willing to do this agrees with this plan.  Referral placed for primary care evaluation.  Patient stable for discharge.    Discharge, treatment, follow up and return to ER precautions discussed with patient in detail.  Patient understands and agrees with plan, no further questions or concerns at this time.         I do not suspect severe sepsis or septic shock    Vital Signs: Reviewed the patient's vital signs.   Nursing Notes: Reviewed and utilized available nursing notes.  Medical Records Reviewed: Reviewed available past medical records.  Counseling: The emergency provider has spoken with the patient and discussed today's findings, in addition to providing specific details for the plan of care.  Questions are answered and there is agreement with the plan.      RADIOLOGY IMAGING STUDIES  No orders to display           PULSE OXIMETRY    Oxygen Saturation by Pulse Oximetry:  100%  Interventions: none  Interpretation: Normal.    EMERGENCY DEPT. MEDICATIONS      ED Medication Orders (From admission, onward)      Start Ordered     Status Ordering Provider    07/28/23 [phone] 07/28/23 0909  lidocaine viscous (XYLOCAINE) 2 % solution 10 mL  Once        Route: Mouth/Throat  Ordered Dose: 10 mL       Last MAR action: Given Jasiel Apachito B    07/28/23 0910 07/28/23 0909  alum & mag hydroxide-simethicone (MAALOX PLUS) 200-200-20 mg/5 mL suspension 30 mL  Once        Route: Oral  Ordered Dose: 30 mL       Last MAR action: Given Aramis Weil B    07/28/23 0910 07/28/23 0909  famotidine (PEPCID) tablet 20 mg  Once        Route: Oral  Ordered Dose: 20 mg       Last MAR action: Given Siarra Gilkerson B            LABORATORY RESULTS    Ordered and independently interpreted AVAILABLE laboratory tests. Please see results section in chart for full details.  Results for orders placed or performed during the hospital encounter of 12/29/21   COVID-19 (SARS-CoV-2) and Influenza A/B, NAA ([facility])    Specimen: Nasopharyngeal; Culturette   Result Value Ref Range    Purpose of COVID testing Diagnostic -PUI     SARS-CoV-2 Specimen Source Nasal Swab     SARS-CoV-2 Overall Result Not Detected     Influenza A Detected (A)     Influenza B Not Detected        CRITICAL CARE/PROCEDURES    Procedures  No critical care time.     DIAGNOSIS      Diagnosis:  Final diagnoses:   Gastroesophageal reflux disease, unspecified whether esophagitis present       Disposition:  ED Disposition       ED Disposition   Discharge    Condition   --    Date/Time   Tue Jul 28, 2023 10:06 AM    Comment   Sohan Abdelrahin I Nooney discharge to home/self care.    Condition at disposition: Stable                 Prescriptions:  Patient's Medications   New Prescriptions    DIPHENHYDRAMINE-LIDOCAINE VISCOUS-NYSTATIN-ALUM & MAG HYDROXIDE-SIMETHICONE (MAGIC MOUTHWASH) SUSPENSION    Take 10 mLs by mouth 2 (two) times daily as needed (for  reflux, epigastric burning associate with eating acidic foods)    FAMOTIDINE (PEPCID) 20 MG TABLET    Take 1 tablet (20 mg) by mouth 2 (two) times daily for 10 days   Previous Medications    BENZONATATE (TESSALON) 100 MG CAPSULE    Take 1 capsule (100 mg) by mouth 3 (three) times daily as needed for Cough    OMEPRAZOLE (PRILOSEC) 20 MG CAPSULE    Take 20 mg by mouth daily   Modified Medications    No medications on file   Discontinued Medications    No medications on file            [1]   Social History  Socioeconomic History    Marital status: Single   Tobacco Use    Smoking status: Every Day  Current packs/day: 0.50     Types: Cigarettes    Smokeless tobacco: Never   Vaping Use    Vaping status: Never Used   Substance and Sexual Activity    Alcohol use: Not Currently     Comment: socially on weekends per pt    Drug use: Never   Social History Narrative    ** Merged History Encounter **             Carmell [place], [place]  07/30/23 0040

## 2023-07-28 NOTE — Discharge Instructions (Signed)
Drink plenty of fluids, follow up with Primary care doctor, take medications as discussed for symptoms.     Avoid alcohol, acidic foods, heavy foods, greasy foods or alcohol.     As discussed recommend you follow-up with neighborhood health I put in a referral for primary care you should be receiving a call to schedule appointment however you can call them directly referral information given below.  Schedule appointment to discuss your persistent symptoms of reflux.      Let them know you have been started on medications by the emergency room but we recommend further testing for H. pylori, reflux and potentially referral to gastroenterology for endoscopy and a colonoscopy as needed.    If any severe or worsening of symptoms return to ER immediately.

## 2023-07-28 NOTE — EDIE (Signed)
[facility] NOTIFICATION 07/28/2023 08:34 [name], Eric A DOB: 1991-09-13 MRN: [id number]    [facility]'s patient encounter information:   WNI:[id number]  Account [id number]  Billing Account [id number]      Criteria Met      5 ED Visits in 12 Months    Security and Safety  No Security Events were found.  ED Care Guidelines  There are currently no ED Care Guidelines for this patient. Please check your facility's medical records system.        Prescription Monitoring Program  Narx Score not available at this time.    E.D. Visit Count (12 mo.)  Facility Visits   [facility] 3   [facility] 2   [facility]   [facility]   [facility] 1   Total 9   Note: Visits indicate total known visits.     Recent Emergency Department Visit Summary  Date Facility [facility] Type Diagnoses or Chief Complaint    Jul 28, 2023  [facility]  [place]  [place]  Emergency      reflex      Mar 27, 2023  [facility]  [place]  [place]  Emergency  Chief Complaint: INFECTION    Mar 07, 2023  [facility].  [place]  [place]  Emergency  Chief Complaint: POSS UTI    Sep 06, 2022  [facility]  [place]  [place]  Emergency     Aug 23, 2022  [facility] H.  [place]  [place]  Emergency      Unspecified abdominal pain      Aug 22, 2022  [facility]  [place]  [place]  Emergency      Left upper quadrant pain      Left lower quadrant pain      Aug 15, 2022  [facility]  [place]  [place]  Emergency      Acute pharyngitis, unspecified      Aug 15, 2022  [facility] H.  [place]  [place]  Emergency      Acute pharyngitis, unspecified      Chills (without fever)      Other fatigue      Aug 13, 2022  [facility]  [place]  [place]  Emergency      Contact with and  (suspected) exposure to COVID-19      Nicotine dependence, unspecified, uncomplicated      Viral infection, unspecified        Recent Inpatient Visit Summary  No Recent Inpatient Visits were found.  Care Team  No Care Team was found.  [facility]  This patient has registered at the [facility] Emergency Department  For more information visit: [email or web address]     PLEASE NOTE:     1.   Any care recommendations and other clinical information are provided as guidelines or for historical purposes only, and providers should exercise their own clinical judgment when providing care.    2.   You may only use this information for purposes of treatment, payment or health care operations activities, and subject to the limitations of applicable [facility] Policies.    3.   You should consult directly with  the organization that provided a care guideline or other clinical history with any questions about additional information or accuracy or completeness of information provided.    ? 2024 [facility] - [email or web address]

## 2023-07-28 NOTE — ED Notes (Signed)
EMERGENCY DEPARTMENT ATTENDING PHYSICIAN NOTE     I performed the substantive portion of the MDM. For the problems addressed, I personally developed, reviewed, and/or approved the plan and assessment as documented by the APP.  Patient was seen by APP, see their note for further documentation of history and exam.    BRIEF HISTORY OF PRESENT ILLNESS AND ADDITIONAL EXAM FINDINGS     Chief Complaint: Epigastric pain       History per APP    32 y.o. male presents with GERD. Had pizza and wings last night. States feels like his heartburn. Denies CP    Triage Vitals:  ED Triage Vitals [07/28/23 0837]   Encounter Vitals Group      BP 140/90      Systolic BP Percentile       Diastolic BP Percentile       Heart Rate 61      Resp Rate 18      Temp 97.7 F (36.5 C)      Temp src       SpO2 100 %      Weight       Height 1.88 m      Head Circumference       Peak Flow       Pain Score 10      Pain Loc       Pain Education       Exclude from Growth Chart             MEDICAL DECISION MAKING   Plan for medications and reassess. If remain with pain would need cardiac work up        Vital Signs: Reviewed the patient's vital signs.   Nursing Notes: Reviewed and utilized available nursing notes.  Medical Records Reviewed: Reviewed available past medical records.    CARDIAC STUDIES    The following cardiac studies were independently interpreted by me the Emergency Medicine Physician.  For full cardiac study results please see chart. I discussed testing results with the APP.              EMERGENCY IMAGING STUDIES    The following imagine studies were independently interpreted by me (emergency medicine physician). I discussed testing results with the APP.                     RADIOLOGY IMAGING STUDIES      No orders to display       EMERGENCY DEPT. MEDICATIONS      ED Medication Orders (From admission, onward)      Start Ordered     Status Ordering Provider    07/28/23 [phone] 07/28/23 0909  lidocaine viscous (XYLOCAINE) 2 % solution 10 mL   Once        Route: Mouth/Throat  Ordered Dose: 10 mL       Last MAR action: Given MCNAMARA, JENNIFER B    07/28/23 0910 07/28/23 0909  alum & mag hydroxide-simethicone (MAALOX PLUS) 200-200-20 mg/5 mL suspension 30 mL  Once        Route: Oral  Ordered Dose: 30 mL       Last MAR action: Given MCNAMARA, JENNIFER B    07/28/23 0910 07/28/23 0909  famotidine (PEPCID) tablet 20 mg  Once        Route: Oral  Ordered Dose: 20 mg       Last MAR action: Given MCNAMARA, JENNIFER B  LABORATORY RESULTS    Ordered and independently interpreted AVAILABLE laboratory tests.   Results       ** No results found for the last 24 hours. **              CRITICAL CARE/PROCEDURES        DIAGNOSIS    I (ED Physician) discussed final disposition with the APP    Diagnosis:  Final diagnoses:   Gastroesophageal reflux disease, unspecified whether esophagitis present       Disposition:  ED Disposition       None            Prescriptions:  Patient's Medications   New Prescriptions    DIPHENHYDRAMINE-LIDOCAINE VISCOUS-NYSTATIN-ALUM & MAG HYDROXIDE-SIMETHICONE (MAGIC MOUTHWASH) SUSPENSION    Take 10 mLs by mouth 2 (two) times daily as needed (for reflux, epigastric burning associate with eating acidic foods)    FAMOTIDINE (PEPCID) 20 MG TABLET    Take 1 tablet (20 mg) by mouth 2 (two) times daily for 10 days   Previous Medications    BENZONATATE (TESSALON) 100 MG CAPSULE    Take 1 capsule (100 mg) by mouth 3 (three) times daily as needed for Cough    OMEPRAZOLE (PRILOSEC) 20 MG CAPSULE    Take 20 mg by mouth daily   Modified Medications    No medications on file   Discontinued Medications    No medications on file          Rogelio Seen, MD  07/28/23 1423

## 2024-06-18 ENCOUNTER — Emergency Department
Admission: EM | Admit: 2024-06-18 | Discharge: 2024-06-18 | Disposition: A | Discharge status: Home / Self Care | Attending: Internal Medicine | Admitting: Internal Medicine

## 2024-06-18 DIAGNOSIS — R079 Chest pain, unspecified: Secondary | ICD-10-CM

## 2024-06-18 DIAGNOSIS — R630 Anorexia: Secondary | ICD-10-CM | POA: Insufficient documentation

## 2024-06-18 DIAGNOSIS — F17210 Nicotine dependence, cigarettes, uncomplicated: Secondary | ICD-10-CM | POA: Insufficient documentation

## 2024-06-18 DIAGNOSIS — R110 Nausea: Secondary | ICD-10-CM | POA: Insufficient documentation

## 2024-06-18 LAB — URINALYSIS WITH REFLEX TO MICROSCOPIC EXAM - REFLEX TO CULTURE
Urine Bilirubin: NEGATIVE
Urine Blood: NEGATIVE
Urine Glucose: NEGATIVE
Urine Ketones: NEGATIVE mg/dL
Urine Leukocyte Esterase: NEGATIVE
Urine Nitrite: NEGATIVE
Urine Protein: NEGATIVE
Urine Specific Gravity: 1.004 (ref 1.001–1.035)
Urine Urobilinogen: NORMAL mg/dL (ref 0.2–2.0)
Urine pH: 7 (ref 5.0–8.0)

## 2024-06-18 LAB — LAB USE ONLY - CBC WITH DIFFERENTIAL
Absolute Basophils: 0.03 x10 3/uL (ref 0.00–0.08)
Absolute Eosinophils: 0.1 x10 3/uL (ref 0.00–0.44)
Absolute Immature Granulocytes: 0 x10 3/uL (ref 0.00–0.07)
Absolute Lymphocytes: 2.12 x10 3/uL (ref 0.42–3.22)
Absolute Monocytes: 0.57 x10 3/uL (ref 0.21–0.85)
Absolute Neutrophils: 3.45 x10 3/uL (ref 1.10–6.33)
Absolute nRBC: 0 x10 3/uL (ref <=0.00)
Basophils %: 0.5 %
Eosinophils %: 1.6 %
Hematocrit: 42.1 % (ref 37.6–49.6)
Hemoglobin: 13.3 g/dL (ref 12.5–17.1)
Immature Granulocytes %: 0 %
Lymphocytes %: 33.8 %
MCH: 22.9 pg — ABNORMAL LOW (ref 25.1–33.5)
MCHC: 31.6 g/dL (ref 31.5–35.8)
MCV: 72.5 fL — ABNORMAL LOW (ref 78.0–96.0)
MPV: 11.1 fL (ref 8.9–12.5)
Monocytes %: 9.1 %
Neutrophils %: 55 %
Platelet Count: 250 x10 3/uL (ref 142–346)
Preliminary Absolute Neutrophil Count: 3.45 x10 3/uL (ref 1.10–6.33)
RBC: 5.81 x10 6/uL (ref 4.20–5.90)
RDW: 15 % (ref 11–15)
WBC: 6.27 x10 3/uL (ref 3.10–9.50)
nRBC %: 0 /100{WBCs} (ref <=0.0)

## 2024-06-18 LAB — COMPREHENSIVE METABOLIC PANEL
ALT: 25 U/L (ref <=55)
AST (SGOT): 22 U/L (ref <=41)
Albumin/Globulin Ratio: 1.1 (ref 0.9–2.2)
Albumin: 3.8 g/dL (ref 3.5–5.0)
Alkaline Phosphatase: 59 U/L (ref 37–117)
Anion Gap: 9 (ref 5.0–15.0)
BUN: 11 mg/dL (ref 9–28)
Bilirubin, Total: 1.2 mg/dL (ref 0.2–1.2)
CO2: 24 meq/L (ref 17–29)
Calcium: 8.9 mg/dL (ref 8.5–10.5)
Chloride: 106 meq/L (ref 99–111)
Creatinine: 1 mg/dL (ref 0.5–1.5)
GFR: >60 mL/min/1.73 m2 (ref >=60.0)
Globulin: 3.4 g/dL (ref 2.0–3.6)
Glucose: 76 mg/dL (ref 70–100)
Potassium: 3.7 meq/L (ref 3.5–5.3)
Protein, Total: 7.2 g/dL (ref 6.0–8.3)
Sodium: 139 meq/L (ref 135–145)

## 2024-06-18 LAB — LIPASE: Lipase: 30 U/L (ref 8–78)

## 2024-06-18 MED ORDER — ONDANSETRON HCL 4 MG/2ML IJ SOLN
4.0000 mg | Freq: Once | INTRAMUSCULAR | Status: AC
Start: 2024-06-18 — End: 2024-06-18
  Administered 2024-06-18: 4 mg via INTRAVENOUS
  Filled 2024-06-18: qty 2

## 2024-06-18 MED ORDER — ALUM & MAG HYDROXIDE-SIMETH 200-200-20 MG/5ML PO SUSP
30.0000 mL | Freq: Once | ORAL | Status: AC
Start: 2024-06-18 — End: 2024-06-18
  Administered 2024-06-18: 30 mL via ORAL
  Filled 2024-06-18: qty 30

## 2024-06-18 MED ORDER — SODIUM CHLORIDE 0.9 % IV BOLUS
1000.0000 mL | Freq: Once | INTRAVENOUS | Status: AC
Start: 2024-06-18 — End: 2024-06-18
  Administered 2024-06-18: 1000 mL via INTRAVENOUS
  Filled 2024-06-18: qty 1000

## 2024-06-18 MED ORDER — FAMOTIDINE 20 MG PO TABS
20.0000 mg | ORAL_TABLET | Freq: Two times a day (BID) | ORAL | 0 refills | Status: AC
Start: 2024-06-18 — End: ?

## 2024-06-18 MED ORDER — ONDANSETRON 4 MG PO TBDP
4.0000 mg | ORAL_TABLET | Freq: Four times a day (QID) | ORAL | 0 refills | Status: AC | PRN
Start: 2024-06-18 — End: ?

## 2024-06-18 NOTE — ED Provider Notes (Signed)
 EMERGENCY DEPARTMENT HISTORY AND PHYSICAL EXAM        Date: 06/18/2024  Patient Name: Eric [name]    History of Presenting Illness     Chief Complaint   Patient presents with    Nausea     Pt speaks very good english.     History of Present Illness  History of Present Illness  Eric [name] is a 33 year old male who presents with nausea and loss of appetite.    He began experiencing a lack of appetite two days ago, describing it as eating just to 'finish your day.' He works night shifts and feels nauseous with an urge to vomit upon waking up after sleeping during the day, although he has not vomited. This pattern has persisted for the past two days.    He denies any current diarrhea but mentions having experienced it about a week ago. No fever or abdominal pain is present.    He is currently taking omeprazole. He smokes cigarettes and drinks alcohol occasionally on weekends. He denies the use of marijuana or other drugs.    During the review of symptoms, he mentions experiencing a slight headache when asked and reports having had some reflux and heartburn symptoms.       Past History     Past Medical History:  Past Medical History:   Diagnosis Date    Colon abnormality     IBS (irritable bowel syndrome)          Review of Systems   Review of Systems     Physical Exam   BP 117/87   Pulse 79   Temp 97.9 F (36.6 C) (Oral)   Resp 20   Ht 6' 2 (1.88 m)   Wt 75 kg   SpO2 100%   BMI 21.23 kg/m   Physical Exam  Vitals reviewed.   Constitutional:       General: He is not in acute distress.     Appearance: Normal appearance. He is not ill-appearing, toxic-appearing or diaphoretic.   HENT:      Head: Normocephalic and atraumatic.      Nose: No congestion or rhinorrhea.   Eyes:      Conjunctiva/sclera: Conjunctivae normal.   Cardiovascular:      Rate and Rhythm: Normal rate and regular rhythm.   Pulmonary:      Effort: Pulmonary effort is normal.      Breath sounds: Normal breath sounds.    Abdominal:      General: Bowel sounds are normal.      Palpations: Abdomen is soft.      Tenderness: There is no abdominal tenderness.   Musculoskeletal:      Cervical back: No rigidity.      Right lower leg: No edema.      Left lower leg: No edema.   Skin:     General: Skin is warm and dry.   Neurological:      Mental Status: He is alert and oriented to person, place, and time.   Psychiatric:         Mood and Affect: Mood normal.         Behavior: Behavior normal.         Thought Content: Thought content normal.         Judgment: Judgment normal.       Diagnostic Study Results     Labs -     Results for orders placed or performed  during the hospital encounter of 06/18/24 (from the past 24 hours)   Comprehensive Metabolic Panel    Collection Time: 06/18/24  5:46 PM   Result Value    Glucose 76    BUN 11    Creatinine 1.0    Sodium 139    Potassium 3.7    Chloride 106    CO2 24    Calcium 8.9    Anion Gap 9.0    GFR >60.0    AST (SGOT) 22    ALT 25    Alkaline Phosphatase 59    Albumin 3.8    Protein, Total 7.2    Globulin 3.4    Albumin/Globulin Ratio 1.1    Bilirubin, Total 1.2    Collection Time: 06/18/24  5:46 PM   Result Value    Lipase 30   CBC with Differential (Component)    Collection Time: 06/18/24  5:46 PM   Result Value    WBC 6.27    Hemoglobin 13.3    Hematocrit 42.1    Platelet Count 250    MPV 11.1    RBC 5.81    MCV 72.5 (L)    MCH 22.9 (L)    MCHC 31.6    RDW 15    nRBC % 0.0    Absolute nRBC 0.00    Preliminary Absolute Neutrophil Count 3.45    Neutrophils % 55.0    Lymphocytes % 33.8    Monocytes % 9.1    Eosinophils % 1.6    Basophils % 0.5    Immature Granulocytes % 0.0    Absolute Neutrophils 3.45    Absolute Lymphocytes 2.12    Absolute Monocytes 0.57    Absolute Eosinophils 0.10    Absolute Basophils 0.03    Absolute Immature Granulocytes 0.00   Urinalysis with Reflex to Microscopic Exam and Culture    Collection Time: 06/18/24  6:46 PM    Specimen: Urine, Clean Catch   Result Value     Urine Color Colorless    Urine Clarity Clear    Urine Specific Gravity 1.004    Urine pH 7.0    Urine Leukocyte Esterase Negative    Urine Nitrite Negative    Urine Protein Negative    Urine Glucose Negative    Urine Ketones Negative    Urine Urobilinogen Normal    Urine Bilirubin Negative    Urine Blood Negative       Radiologic Studies -   No results found..      Medical Decision Making   I am the first provider for this patient.    Vital Signs-Reviewed the patient's vital signs.   Patient Vitals for the past 12 hrs:   BP Temp Pulse Resp   06/18/24 1738 117/87 97.9 F (36.6 C) 79 20       Pulse Oximetry Analysis - Normal 100% on room air, interpreted by me    EKG:  Interpreted by the EP.   Time Interpreted:    Rate: 80   Rhythm: Normal Sinus Rhythm 1st degree av block   Interpretation: no acute st changes      Assessment & Plan         Medical Decision Making  Amount and/or Complexity of Data Reviewed  External Data Reviewed: notes.  Labs: ordered. Decision-making details documented in ED Course.     Details: UA: No UTI blood, CHEM panel is normal, lipase is normal, complete blood count is normal  ECG/medicine tests: ordered and  independent interpretation performed. Decision-making details documented in ED Course.    Risk  OTC drugs.  Prescription drug management.        Primary Admitting Service:    I have discussed this case with Dr. PIERRETTE {AdmittingService:53790} at *** (time), who accepts patient for admission and requests {Obs vs Inpatient:53791} {Dispo Unit:53792} bed.     For Surgical Admissions (or any patients with potential surgical or procedural intervention during admission): only if applicable, otherwise delete this section    Anticoagulated: {YES/NO:21936}. If yes, name of medication: ***  Last PO intake: *** (date/time)  Current NPO status: *** (e.g. NPO now, NPO after midnight, etc)    Consultant(s): (if applicable, otherwise delete this section)    I have discussed this case with consultant Dr. PIERRETTE  (name and specialty) at *** (time).   Recommendations were as follows: ***                  Provider Notes: ***    Procedures:    Core Measures:    Critical Care Time:     Diagnosis     Clinical Impression: No diagnosis found.    _______________________________    Attestations:  This note is prepared by Arlean Critchley, MD.     Arlean Critchley, MD.  I confirm that the note above accurately reflects all work, treatment, procedures, and medical decision making performed by me.    _______________________________

## 2024-06-18 NOTE — ED Triage Notes (Signed)
 [facility] EMERGENCY DEPARTMENT  Provider in Triage Note        Patient Name: Eric [name]    Chief Complaint: No chief complaint on file.      HPI: Eric [name] is a 33 y.o. male, who has had a rapid medical screening evaluation initiated by myself. Presents with heartburn and nausea x 2 days. Reports loss of appetite. Denies fever, CP, SOB, abd pain, urinary symptoms. Reports intermittent episodes of diarrhea. No blood in stool.     Medical/Surgical/Social history: as per HPI    Vitals: BP 117/87   Pulse 79   Temp 97.9 F (36.6 C) (Oral)   Resp 20   Ht 6' 2 (1.88 m)   Wt 75 kg   SpO2 100%   BMI 21.23 kg/m     Pertinent brief exam:   Examination of area of concern: no abd tenderness     Preliminary orders: EKG,labs, UA     Symptom based preliminary diagnosis/MDM: nausea     Patient advised to remain in the ED until further evaluation can be performed. Patient instructed to notify staff of any changes in condition while waiting.  This assessment is an initial evaluation to expedite care.

## 2024-06-18 NOTE — Discharge Instructions (Signed)
 Follow up with your doctor

## 2024-06-18 NOTE — ED Triage Notes (Signed)
 Eric [name] is a 33 y.o. male c/o GERD symptoms x2 days. Pt states he feels increased loss of appetite and nausea beginning 2 days ago. Pt denies abdominal pain, cp. Endorses diarrhea. Denies blood in stool. No GI surgical hx. No fevers, no recent travel, no change in diet.

## 2024-06-19 LAB — ECG 12-LEAD
Atrial Rate: 80 {beats}/min
P Axis: 73 degrees
P-R Interval: 254 ms
Q-T Interval: 322 ms
QRS Duration: 74 ms
QTC Calculation (Bezet): 371 ms
R Axis: 86 degrees
T Axis: 57 degrees
Ventricular Rate: 80 {beats}/min

## 2024-06-19 LAB — LAB USE ONLY - URINE GRAY CULTURE HOLD TUBE
